# Patient Record
Sex: Male | Born: 1970 | Race: Black or African American | Hispanic: No | Smoking: Never smoker
Health system: Southern US, Community
[De-identification: ages and names within clinical notes are randomized; demographics above are authoritative.]

---

## 1998-10-25 ENCOUNTER — Encounter: Payer: Self-pay | Admitting: Emergency Medicine

## 1998-10-25 ENCOUNTER — Emergency Department (HOSPITAL_COMMUNITY): Admission: EM | Admit: 1998-10-25 | Discharge: 1998-10-25 | Payer: Self-pay | Admitting: Emergency Medicine

## 2000-02-05 ENCOUNTER — Emergency Department (HOSPITAL_COMMUNITY): Admission: EM | Admit: 2000-02-05 | Discharge: 2000-02-05 | Payer: Self-pay | Admitting: Emergency Medicine

## 2000-02-05 ENCOUNTER — Encounter: Payer: Self-pay | Admitting: Emergency Medicine

## 2001-05-04 ENCOUNTER — Emergency Department (HOSPITAL_COMMUNITY): Admission: EM | Admit: 2001-05-04 | Discharge: 2001-05-04 | Payer: Self-pay | Admitting: Emergency Medicine

## 2001-05-08 ENCOUNTER — Emergency Department (HOSPITAL_COMMUNITY): Admission: EM | Admit: 2001-05-08 | Discharge: 2001-05-08 | Payer: Self-pay | Admitting: *Deleted

## 2004-05-02 ENCOUNTER — Emergency Department (HOSPITAL_COMMUNITY): Admission: EM | Admit: 2004-05-02 | Discharge: 2004-05-02 | Payer: Self-pay | Admitting: Emergency Medicine

## 2005-08-12 ENCOUNTER — Emergency Department (HOSPITAL_COMMUNITY): Admission: EM | Admit: 2005-08-12 | Discharge: 2005-08-12 | Payer: Self-pay | Admitting: Emergency Medicine

## 2009-02-05 ENCOUNTER — Emergency Department (HOSPITAL_COMMUNITY): Admission: EM | Admit: 2009-02-05 | Discharge: 2009-02-05 | Payer: Self-pay | Admitting: Emergency Medicine

## 2009-12-19 ENCOUNTER — Emergency Department (HOSPITAL_COMMUNITY): Admission: EM | Admit: 2009-12-19 | Discharge: 2009-12-19 | Payer: Self-pay | Admitting: Emergency Medicine

## 2010-01-02 ENCOUNTER — Emergency Department (HOSPITAL_COMMUNITY): Admission: EM | Admit: 2010-01-02 | Discharge: 2010-01-02 | Payer: Self-pay | Admitting: Emergency Medicine

## 2010-01-28 ENCOUNTER — Emergency Department (HOSPITAL_COMMUNITY): Admission: EM | Admit: 2010-01-28 | Discharge: 2010-01-28 | Payer: Self-pay | Admitting: Emergency Medicine

## 2010-06-15 ENCOUNTER — Emergency Department (HOSPITAL_COMMUNITY): Admission: EM | Admit: 2010-06-15 | Discharge: 2010-06-15 | Payer: Self-pay | Admitting: Emergency Medicine

## 2010-11-15 LAB — URINALYSIS, ROUTINE W REFLEX MICROSCOPIC
Bilirubin Urine: NEGATIVE
Glucose, UA: NEGATIVE mg/dL
Ketones, ur: NEGATIVE mg/dL
Leukocytes, UA: NEGATIVE
Protein, ur: NEGATIVE mg/dL
Protein, ur: NEGATIVE mg/dL
Urobilinogen, UA: 0.2 mg/dL (ref 0.0–1.0)

## 2010-11-15 LAB — URINE CULTURE: Culture: NO GROWTH

## 2010-11-15 LAB — POCT I-STAT, CHEM 8
Chloride: 104 mEq/L (ref 96–112)
HCT: 39 % (ref 39.0–52.0)
Potassium: 3.8 mEq/L (ref 3.5–5.1)
Sodium: 140 mEq/L (ref 135–145)

## 2010-11-15 LAB — URINE MICROSCOPIC-ADD ON

## 2010-12-05 LAB — CSF CELL COUNT WITH DIFFERENTIAL
RBC Count, CSF: 1 /mm3 — ABNORMAL HIGH
Tube #: 1

## 2010-12-05 LAB — CSF CULTURE W GRAM STAIN: Gram Stain: NONE SEEN

## 2010-12-05 LAB — URINALYSIS, ROUTINE W REFLEX MICROSCOPIC
Bilirubin Urine: NEGATIVE
Glucose, UA: NEGATIVE mg/dL
Ketones, ur: NEGATIVE mg/dL
Nitrite: NEGATIVE
Protein, ur: NEGATIVE mg/dL

## 2010-12-05 LAB — GRAM STAIN: Gram Stain: NONE SEEN

## 2011-01-12 ENCOUNTER — Emergency Department (HOSPITAL_COMMUNITY): Payer: Self-pay

## 2011-01-12 ENCOUNTER — Encounter (HOSPITAL_COMMUNITY): Payer: Self-pay | Admitting: Radiology

## 2011-01-12 ENCOUNTER — Emergency Department (HOSPITAL_COMMUNITY)
Admission: EM | Admit: 2011-01-12 | Discharge: 2011-01-13 | Disposition: A | Discharge status: Home / Self Care | Payer: Self-pay | Attending: Emergency Medicine | Admitting: Emergency Medicine

## 2011-01-12 DIAGNOSIS — I517 Cardiomegaly: Secondary | ICD-10-CM | POA: Insufficient documentation

## 2011-01-12 DIAGNOSIS — M129 Arthropathy, unspecified: Secondary | ICD-10-CM | POA: Insufficient documentation

## 2011-01-12 DIAGNOSIS — R0602 Shortness of breath: Secondary | ICD-10-CM | POA: Insufficient documentation

## 2011-01-12 DIAGNOSIS — R0789 Other chest pain: Secondary | ICD-10-CM | POA: Insufficient documentation

## 2011-01-12 LAB — URINALYSIS, ROUTINE W REFLEX MICROSCOPIC
Bilirubin Urine: NEGATIVE
Glucose, UA: NEGATIVE mg/dL
Ketones, ur: NEGATIVE mg/dL
Nitrite: NEGATIVE
Specific Gravity, Urine: 1.015 (ref 1.005–1.030)
pH: 6 (ref 5.0–8.0)

## 2011-01-12 LAB — BASIC METABOLIC PANEL
BUN: 12 mg/dL (ref 6–23)
CO2: 28 mEq/L (ref 19–32)
Chloride: 96 mEq/L (ref 96–112)
Glucose, Bld: 103 mg/dL — ABNORMAL HIGH (ref 70–99)
Potassium: 3.8 mEq/L (ref 3.5–5.1)
Sodium: 133 mEq/L — ABNORMAL LOW (ref 135–145)

## 2011-01-12 LAB — URINE MICROSCOPIC-ADD ON

## 2011-01-12 LAB — DIFFERENTIAL
Eosinophils Absolute: 0.2 10*3/uL (ref 0.0–0.7)
Eosinophils Relative: 3 % (ref 0–5)
Lymphs Abs: 2.1 10*3/uL (ref 0.7–4.0)
Monocytes Absolute: 0.7 10*3/uL (ref 0.1–1.0)

## 2011-01-12 LAB — CBC
HCT: 34.4 % — ABNORMAL LOW (ref 39.0–52.0)
MCH: 31 pg (ref 26.0–34.0)
MCHC: 36 g/dL (ref 30.0–36.0)
MCV: 86 fL (ref 78.0–100.0)
Platelets: 304 10*3/uL (ref 150–400)
RDW: 13.7 % (ref 11.5–15.5)
WBC: 7.2 10*3/uL (ref 4.0–10.5)

## 2011-01-12 LAB — POCT CARDIAC MARKERS: CKMB, poc: 2.5 ng/mL (ref 1.0–8.0)

## 2011-01-12 MED ORDER — IOHEXOL 300 MG/ML  SOLN
100.0000 mL | Freq: Once | INTRAMUSCULAR | Status: AC | PRN
  Administered 2011-01-12: 100 mL via INTRAVENOUS

## 2011-05-04 ENCOUNTER — Emergency Department (HOSPITAL_COMMUNITY)
Admission: EM | Admit: 2011-05-04 | Discharge: 2011-05-04 | Discharge status: Against Medical Advice | Payer: Self-pay | Attending: Emergency Medicine | Admitting: Emergency Medicine

## 2011-05-04 DIAGNOSIS — Z0389 Encounter for observation for other suspected diseases and conditions ruled out: Secondary | ICD-10-CM | POA: Insufficient documentation

## 2011-05-04 LAB — URINALYSIS, ROUTINE W REFLEX MICROSCOPIC
Bilirubin Urine: NEGATIVE
Nitrite: NEGATIVE
Specific Gravity, Urine: 1.014 (ref 1.005–1.030)
Urobilinogen, UA: 0.2 mg/dL (ref 0.0–1.0)
pH: 8 (ref 5.0–8.0)

## 2011-05-04 LAB — URINE MICROSCOPIC-ADD ON

## 2011-07-27 ENCOUNTER — Emergency Department (HOSPITAL_COMMUNITY): Payer: Self-pay

## 2011-07-27 ENCOUNTER — Emergency Department (HOSPITAL_COMMUNITY)
Admission: EM | Admit: 2011-07-27 | Discharge: 2011-07-27 | Disposition: A | Discharge status: Home / Self Care | Payer: Self-pay | Attending: Emergency Medicine | Admitting: Emergency Medicine

## 2011-07-27 DIAGNOSIS — M25561 Pain in right knee: Secondary | ICD-10-CM

## 2011-07-27 DIAGNOSIS — M25532 Pain in left wrist: Secondary | ICD-10-CM

## 2011-07-27 DIAGNOSIS — M25569 Pain in unspecified knee: Secondary | ICD-10-CM | POA: Insufficient documentation

## 2011-07-27 DIAGNOSIS — M25539 Pain in unspecified wrist: Secondary | ICD-10-CM | POA: Insufficient documentation

## 2011-07-27 MED ORDER — IBUPROFEN 600 MG PO TABS: 600.0000 mg | ORAL_TABLET | Freq: Three times a day (TID) | ORAL | Status: AC | PRN

## 2011-07-27 MED ORDER — IBUPROFEN 200 MG PO TABS
600.0000 mg | ORAL_TABLET | Freq: Once | ORAL | Status: AC
  Administered 2011-07-27: 600 mg via ORAL
  Filled 2011-07-27: qty 3

## 2011-07-27 NOTE — ED Provider Notes (Signed)
History     CSN: [id number] Arrival date & time: 07/27/2011 12:27 AM   First MD Initiated Contact with Patient 07/27/11 0028      Chief Complaint  Patient presents with  . Knee Pain    and left wrist pain     (Consider location/radiation/quality/duration/timing/severity/associated sxs/prior treatment) The history is provided by the patient and the police.   please report the patient was in the backseat of the police vehicle and refused to get out and thus he was dragged out of the car.  He now reports pain in his right knee and his left wrist.  He reports pain with range of motion.  His pain is moderate to severe.  Nothing worsens the symptoms don't improve his symptoms.  Symptoms are moderate.  He denies weakness numbness or tingling.  He has no other complaints  No past medical history on file.  No past surgical history on file.  No family history on file.  History  Substance Use Topics  . Smoking status: Not on file  . Smokeless tobacco: Not on file  . Alcohol Use: Not on file      Review of Systems  All other systems reviewed and are negative.    Allergies  Review of patient's allergies indicates no known allergies.  Home Medications  No current outpatient prescriptions on file.  BP 132/89  Pulse 95  Temp(Src) 98.6 F (37 C) (Oral)  Resp 20  SpO2 96%  Physical Exam  Nursing note and vitals reviewed. Constitutional: He is oriented to person, place, and time. He appears well-developed and well-nourished.  HENT:  Head: Normocephalic and atraumatic.  Eyes: EOM are normal.  Neck: Normal range of motion.  Cardiovascular: Normal rate, regular rhythm, normal heart sounds and intact distal pulses.   Pulmonary/Chest: Effort normal and breath sounds normal. No respiratory distress.  Abdominal: Soft. He exhibits no distension. There is no tenderness.  Musculoskeletal:       Patient with tenderness of his lateral joint line of his right knee.  There is no obvious  deformity.  His right leg is neurovascularly intact with normal sensation pulses and motor function.  His left wrist is mildly tender at the proximal portion of his first metacarpal.  There is no deformity or swelling or ecchymosis.  He has normal range of motion of his left wrist.  Neurological: He is alert and oriented to person, place, and time.  Skin: Skin is warm and dry.  Psychiatric: He has a normal mood and affect. Judgment normal.    ED Course  Procedures (including critical care time)  Labs Reviewed - No data to display Dg Wrist Complete Left  07/27/2011  *RADIOLOGY REPORT*  Clinical Data: Left wrist injury.  LEFT WRIST - COMPLETE 3+ VIEW  Comparison:  None.  Findings:  There is no evidence of fracture or dislocation.  There is no evidence of arthropathy or other focal bone abnormality. Soft tissues are unremarkable.  IMPRESSION: Negative.  Original Report Authenticated By: Reola Calkins, M.D.   Dg Knee Complete 4 Views Right  07/27/2011  *RADIOLOGY REPORT*  Clinical Data: Right knee injury.  RIGHT KNEE - COMPLETE 4+ VIEW  Comparison:  None.  Findings:  There is no evidence of fracture, dislocation, or joint effusion.  There is no evidence of arthropathy or other focal bone abnormality.  Soft tissues are unremarkable.  IMPRESSION: Negative.  Original Report Authenticated By: Reola Calkins, M.D.   I personally reviewed the x-ray  1. Pain  in right knee   2. Pain in left wrist       MDM  We'll obtain images to evaluate for fracture.  Ibuprofen given for pain        Lyanne Co, MD 07/27/11 [phone]

## 2011-07-27 NOTE — ED Notes (Signed)
Pt is not under [employer] custody and was provided a bus pass for d/c home. [employer] remains at bedside d/t pt's irritability. Discharge instructions and information given and pt ambulatory with [employer] escorting out of department.

## 2011-07-27 NOTE — ED Notes (Signed)
[employer] bedside 

## 2011-12-19 ENCOUNTER — Emergency Department (HOSPITAL_COMMUNITY): Payer: Self-pay

## 2011-12-19 ENCOUNTER — Emergency Department (HOSPITAL_COMMUNITY)
Admission: EM | Admit: 2011-12-19 | Discharge: 2011-12-19 | Disposition: A | Discharge status: Home / Self Care | Payer: Self-pay | Attending: Emergency Medicine | Admitting: Emergency Medicine

## 2011-12-19 ENCOUNTER — Encounter (HOSPITAL_COMMUNITY): Payer: Self-pay | Admitting: Family Medicine

## 2011-12-19 DIAGNOSIS — F1010 Alcohol abuse, uncomplicated: Secondary | ICD-10-CM | POA: Insufficient documentation

## 2011-12-19 DIAGNOSIS — R109 Unspecified abdominal pain: Secondary | ICD-10-CM | POA: Insufficient documentation

## 2011-12-19 DIAGNOSIS — R112 Nausea with vomiting, unspecified: Secondary | ICD-10-CM | POA: Insufficient documentation

## 2011-12-19 LAB — ETHANOL: Alcohol, Ethyl (B): <11 mg/dL (ref 0–11)

## 2011-12-19 LAB — DIFFERENTIAL
Basophils Relative: 0 % (ref 0–1)
Monocytes Absolute: 0.5 10*3/uL (ref 0.1–1.0)
Monocytes Relative: 8 % (ref 3–12)
Neutro Abs: 4.9 10*3/uL (ref 1.7–7.7)

## 2011-12-19 LAB — CBC
HCT: 36.6 % — ABNORMAL LOW (ref 39.0–52.0)
Hemoglobin: 13.2 g/dL (ref 13.0–17.0)
MCH: 31.4 pg (ref 26.0–34.0)
MCHC: 36.1 g/dL — ABNORMAL HIGH (ref 30.0–36.0)
MCV: 87.1 fL (ref 78.0–100.0)

## 2011-12-19 LAB — COMPREHENSIVE METABOLIC PANEL
Albumin: 4.4 g/dL (ref 3.5–5.2)
BUN: 9 mg/dL (ref 6–23)
Chloride: 96 mEq/L (ref 96–112)
Creatinine, Ser: 0.68 mg/dL (ref 0.50–1.35)
GFR calc non Af Amer: >90 mL/min (ref >90)
Total Bilirubin: 0.7 mg/dL (ref 0.3–1.2)

## 2011-12-19 LAB — LIPASE, BLOOD: Lipase: 18 U/L (ref 11–59)

## 2011-12-19 MED ORDER — MORPHINE SULFATE 4 MG/ML IJ SOLN
4.0000 mg | INTRAMUSCULAR | Status: DC | PRN
  Administered 2011-12-19: 4 mg via INTRAVENOUS
  Filled 2011-12-19: qty 1

## 2011-12-19 MED ORDER — ONDANSETRON HCL 4 MG/2ML IJ SOLN
4.0000 mg | INTRAMUSCULAR | Status: AC | PRN
  Administered 2011-12-19 (×2): 4 mg via INTRAVENOUS
  Filled 2011-12-19 (×2): qty 2

## 2011-12-19 MED ORDER — PANTOPRAZOLE SODIUM 40 MG IV SOLR
40.0000 mg | Freq: Once | INTRAVENOUS | Status: AC
  Administered 2011-12-19: 40 mg via INTRAVENOUS
  Filled 2011-12-19: qty 40

## 2011-12-19 MED ORDER — FAMOTIDINE IN NACL 20-0.9 MG/50ML-% IV SOLN
20.0000 mg | Freq: Once | INTRAVENOUS | Status: AC
  Administered 2011-12-19: 20 mg via INTRAVENOUS
  Filled 2011-12-19: qty 50

## 2011-12-19 MED ORDER — OMEPRAZOLE 20 MG PO CPDR: 20.0000 mg | DELAYED_RELEASE_CAPSULE | Freq: Every day | ORAL | Status: DC

## 2011-12-19 MED ORDER — SODIUM CHLORIDE 0.9 % IV SOLN
INTRAVENOUS | Status: DC
  Administered 2011-12-19: 14:00:00 via INTRAVENOUS

## 2011-12-19 MED ORDER — METOCLOPRAMIDE HCL 10 MG PO TABS: 10.0000 mg | ORAL_TABLET | Freq: Four times a day (QID) | ORAL | Status: DC | PRN

## 2011-12-19 MED ORDER — TRAMADOL HCL 50 MG PO TABS: 50.0000 mg | ORAL_TABLET | Freq: Four times a day (QID) | ORAL | Status: AC | PRN

## 2011-12-19 MED ORDER — IOHEXOL 300 MG/ML  SOLN
100.0000 mL | Freq: Once | INTRAMUSCULAR | Status: AC | PRN
  Administered 2011-12-19: 100 mL via INTRAVENOUS

## 2011-12-19 NOTE — Discharge Instructions (Signed)
Do not drink any alcohol. He drinks anything with alcohol in it, your stomach will get worse. A prescription was written for Prilosec, but you can substitute over-the-counter Prilosec or over-the-counter Pepcid for the Prilosec if he can't afford the prescription.  Abdominal Pain Abdominal pain can be caused by many things. Your caregiver decides the seriousness of your pain by an examination and possibly blood tests and X-rays. Many cases can be observed and treated at home. Most abdominal pain is not caused by a disease and will probably improve without treatment. However, in many cases, more time must pass before a clear cause of the pain can be found. Before that point, it may not be known if you need more testing, or if hospitalization or surgery is needed. HOME CARE INSTRUCTIONS   Do not take laxatives unless directed by your caregiver.   Take pain medicine only as directed by your caregiver.   Only take over-the-counter or prescription medicines for pain, discomfort, or fever as directed by your caregiver.   Try a clear liquid diet (broth, tea, or water) for as long as directed by your caregiver. Slowly move to a bland diet as tolerated.  SEEK IMMEDIATE MEDICAL CARE IF:   The pain does not go away.   You have a fever.   You keep throwing up (vomiting).   The pain is felt only in portions of the abdomen. Pain in the right side could possibly be appendicitis. In an adult, pain in the left lower portion of the abdomen could be colitis or diverticulitis.   You pass bloody or black tarry stools.  MAKE SURE YOU:   Understand these instructions.   Will watch your condition.   Will get help right away if you are not doing well or get worse.  Document Released: 05/24/2005 Document Revised: 08/03/2011 Document Reviewed: 04/01/2008 [facility] Patient Information 2012 [place], [place].  Alcoholic Gastritis You have alcoholic gastritis. This is an inflammation of the lining of the stomach.  It is caused by drinking alcohol. The symptoms may include: burning abdominal pain, nausea, vomiting or even vomiting blood. It can be made worse by a poor diet. People who drink frequently often do not eat well. Taking aspirin or other anti-inflammatory medications increases stomach irritation and bleeding. These medicines should be avoided. Treatment is aimed at the cause. You have to stop drinking alcohol if you want to get better. Eat a healthy, well-balanced diet. You may take liquid antacids as needed. Your caregiver may prescribe medications to help heal the stomach lining. Take these as prescribed. PREVENTION   Anyone who has experienced alcoholic gastritis should consider that alcoholism may be an issue. Professional evaluation is highly recommended.   Although some people can recover without help, most need assistance. With treatment and support, many are able to stop drinking and rebuild their lives. Long-term recovery is possible.   Alcohol Addiction cannot be cured, but it can be treated successfully. Treatment centers are listed in telephone listings under:   Alcoholism and Addiction Treatment; Substance Abuse Treatment or Cocaine, Narcotics and Alcoholics Anonymous. Most hospitals and clinics can refer you to a specialized care center.   The [place] government maintains a toll-free number for treatment referrals: 807-206-3397 or 810-143-0466 (TDD). They also maintain a website: http://findtreatment.RockToxic.pl. Other websites for more information are: www.mentalhealth.RockToxic.pl and GreatestFeeling.tn.   In [place], treatment resources are listed in each province. Listings are available under:   Oncologist for [employer] or similar titles.  SEEK IMMEDIATE MEDICAL CARE IF:  You develop severe abdominal pain, uncontrolled vomiting, or vomiting blood.   You blackout or have fainting spells.   You develop seizures (this could be life threatening).   You develop bloody stools or  stools that appear black or tarry.  Document Released: 09/21/2004 Document Revised: 08/03/2011 Document Reviewed: 08/18/2009 [facility] Patient Information 2012 [place], [place].  Omeprazole capsule (Dr. Reddy's OTC) What is this medicine? OMEPRAZOLE (oh ME pray zol) prevents the production of acid in the stomach. It is used to treat the symptoms of heartburn. You can buy this medicine without a prescription. This product is not for long-term use, unless otherwise directed by your doctor or health care professional. This medicine may be used for other purposes; ask your health care provider or pharmacist if you have questions. What should I tell my health care provider before I take this medicine? They need to know if you have any of these conditions: -black or bloody stools -chest pain -difficulty swallowing -have had heartburn for over 3 months -have heartburn with dizziness, lightheadedness or sweating -liver disease -stomach pain -unexplained weight loss -vomiting with blood -wheezing -an unusual or allergic reaction to omeprazole, other medicines, foods, dyes, or preservatives -pregnant or trying to get pregnant -breast-feeding How should I use this medicine? Take this medicine by mouth. Follow the directions on the product label. If you are taking this medicine without a prescription, take one tablet every day. Do not use for longer than 14 days or repeat a course of treatment more often than every 4 months unless directed by a doctor or healthcare professional. Take your dose at regular intervals every 24 hours. Swallow the tablet whole with a drink of water. Do not crush, break or chew. This medicine works best if taken on an empty stomach 30 minutes before breakfast. If you are using this medicine with the prescription of your doctor or healthcare professional, follow the directions you were given. Do not take your medicine more often than directed. Talk to your pediatrician regarding  the use of this medicine in children. Special care may be needed. Overdosage: If you think you've taken too much of this medicine contact a poison control center or emergency room at once. Overdosage: If you think you have taken too much of this medicine contact a poison control center or emergency room at once. NOTE: This medicine is only for you. Do not share this medicine with others. What if I miss a dose? If you miss a dose, take it as soon as you can. If it is almost time for your next dose, take only that dose. Do not take double or extra doses. What may interact with this medicine? Do not take this medicine with any of the following medications: -atazanavir -certain medicines that treat or prevent blood clots like clopidogrel, warfarin -nelfinavir  This medicine may also interact with the following medications: -ampicillin -certain medicines for anxiety or sleep -cyclosporine -diazepam -digoxin -disulfiram -iron salts -phenytoin -prescription medicine for fungal or yeast infection like itraconazole, ketoconazole, voriconazole -saquinavir -tacrolimus This list may not describe all possible interactions. Give your health care provider a list of all the medicines, herbs, non-prescription drugs, or dietary supplements you use. Also tell them if you smoke, drink alcohol, or use illegal drugs. Some items may interact with your medicine. What should I watch for while using this medicine? It can take several days before your heartburn gets better. Check with your doctor or health care professional if your condition does not start to get  better, or if it gets worse. Do not treat yourself for heartburn with this medicine for more than 14 days in a row. You should only use this medicine for a 2-week treatment period once every 4 months. If your symptoms return shortly after your therapy is complete, or within the 4 month time frame, call your doctor or health care professional. What side  effects may I notice from receiving this medicine? Side effects that you should report to your doctor or health care professional as soon as possible: -blood in urine -bone, muscle or joint pain -chest pain or tightness -dark yellow or brown urine -fever or sore throat -redness, blistering, peeling or loosening of the skin, including inside the mouth -shortness of breath -skin rash -yellowing of the eyes or skin  Side effects that usually do not require medical attention (Report these to your doctor or health care professional if they continue or are bothersome.): -diarrhea or constipation -headache This list may not describe all possible side effects. Call your doctor for medical advice about side effects. You may report side effects to FDA at 1-800-FDA-1088. Where should I keep my medicine? Keep out of the reach of children. Store at room temperature between 20 and 25 degrees C (68 and 77 degrees F). Protect from light and moisture. Throw away any unused medicine after the expiration date. NOTE: This sheet is a summary. It may not cover all possible information. If you have questions about this medicine, talk to your doctor, pharmacist, or health care provider.  2012, [employer]vier/[employer] Standard. (06/15/2010 10:19:01 PM)  Tramadol tablets What is this medicine? TRAMADOL (TRA ma dole) is a pain reliever. It is used to treat moderate to severe pain in adults. This medicine may be used for other purposes; ask your health care provider or pharmacist if you have questions. What should I tell my health care provider before I take this medicine? They need to know if you have any of these conditions: -brain tumor -depression -drug abuse or addiction -head injury -if you frequently drink alcohol containing drinks -kidney disease or trouble passing urine -liver disease -lung disease, asthma, or breathing problems -seizures or epilepsy -suicidal thoughts, plans, or attempt; a previous suicide  attempt by you or a family member -an unusual or allergic reaction to tramadol, codeine, other medicines, foods, dyes, or preservatives -pregnant or trying to get pregnant -breast-feeding How should I use this medicine? Take this medicine by mouth with a full glass of water. Follow the directions on the prescription label. If the medicine upsets your stomach, take it with food or milk. Do not take more medicine than you are told to take. Talk to your pediatrician regarding the use of this medicine in children. Special care may be needed. Overdosage: If you think you have taken too much of this medicine contact a poison control center or emergency room at once. NOTE: This medicine is only for you. Do not share this medicine with others. What if I miss a dose? If you miss a dose, take it as soon as you can. If it is almost time for your next dose, take only that dose. Do not take double or extra doses. What may interact with this medicine? Do not take this medicine with any of the following medications: -MAOIs like Carbex, Eldepryl, Marplan, Nardil, and Parnate This medicine may also interact with the following medications: -alcohol or medicines that contain alcohol -antihistamines -benzodiazepines -bupropion -carbamazepine or oxcarbazepine -clozapine -cyclobenzaprine -digoxin -furazolidone -linezolid -medicines for depression, anxiety,  or psychotic disturbances -medicines for migraine headache like almotriptan, eletriptan, frovatriptan, naratriptan, rizatriptan, sumatriptan, zolmitriptan -medicines for pain like pentazocine, buprenorphine, butorphanol, meperidine, nalbuphine, and propoxyphene -medicines for sleep -muscle relaxants -naltrexone -phenobarbital -phenothiazines like perphenazine, thioridazine, chlorpromazine, mesoridazine, fluphenazine, prochlorperazine, promazine, and trifluoperazine -procarbazine -warfarin This list may not describe all possible interactions. Give your  health care provider a list of all the medicines, herbs, non-prescription drugs, or dietary supplements you use. Also tell them if you smoke, drink alcohol, or use illegal drugs. Some items may interact with your medicine. What should I watch for while using this medicine? Tell your doctor or health care professional if your pain does not go away, if it gets worse, or if you have new or a different type of pain. You may develop tolerance to the medicine. Tolerance means that you will need a higher dose of the medicine for pain relief. Tolerance is normal and is expected if you take this medicine for a long time. Do not suddenly stop taking your medicine because you may develop a severe reaction. Your body becomes used to the medicine. This does NOT mean you are addicted. Addiction is a behavior related to getting and using a drug for a non-medical reason. If you have pain, you have a medical reason to take pain medicine. Your doctor will tell you how much medicine to take. If your doctor wants you to stop the medicine, the dose will be slowly lowered over time to avoid any side effects. You may get drowsy or dizzy. Do not drive, use machinery, or do anything that needs mental alertness until you know how this medicine affects you. Do not stand or sit up quickly, especially if you are an older patient. This reduces the risk of dizzy or fainting spells. Alcohol can increase or decrease the effects of this medicine. Avoid alcoholic drinks. You may have constipation. Try to have a bowel movement at least every 2 to 3 days. If you do not have a bowel movement for 3 days, call your doctor or health care professional. Your mouth may get dry. Chewing sugarless gum or sucking hard candy, and drinking plenty of water may help. Contact your doctor if the problem does not go away or is severe. What side effects may I notice from receiving this medicine? Side effects that you should report to your doctor or health care  professional as soon as possible: -allergic reactions like skin rash, itching or hives, swelling of the face, lips, or tongue -breathing difficulties, wheezing -confusion -itching -light headedness or fainting spells -redness, blistering, peeling or loosening of the skin, including inside the mouth -seizures Side effects that usually do not require medical attention (report to your doctor or health care professional if they continue or are bothersome): -constipation -dizziness -drowsiness -headache -nausea, vomiting This list may not describe all possible side effects. Call your doctor for medical advice about side effects. You may report side effects to FDA at 1-800-FDA-1088. Where should I keep my medicine? Keep out of the reach of children. Store at room temperature between 15 and 30 degrees C (59 and 86 degrees F). Keep container tightly closed. Throw away any unused medicine after the expiration date. NOTE: This sheet is a summary. It may not cover all possible information. If you have questions about this medicine, talk to your doctor, pharmacist, or health care provider.  2012, Elsevier/Gold Standard. (04/27/2010 11:55:44 AM)  Metoclopramide tablets What is this medicine? METOCLOPRAMIDE (met oh kloe PRA mide) is used to  treat the symptoms of gastroesophageal reflux disease (GERD) like heartburn. It is also used to treat people with slow emptying of the stomach and intestinal tract. This medicine may be used for other purposes; ask your health care provider or pharmacist if you have questions. What should I tell my health care provider before I take this medicine? They need to know if you have any of these conditions: -breast cancer -depression -diabetes -heart failure -high blood pressure -kidney disease -liver disease -Parkinson's disease or a movement disorder -pheochromocytoma -seizures -stomach obstruction, bleeding, or perforation -an unusual or allergic reaction to  metoclopramide, procainamide, sulfites, other medicines, foods, dyes, or preservatives -pregnant or trying to get pregnant -breast-feeding How should I use this medicine? Take this medicine by mouth with a glass of water. Follow the directions on the prescription label. Take this medicine on an empty stomach, about 30 minutes before eating. Take your doses at regular intervals. Do not take your medicine more often than directed. Do not stop taking except on the advice of your doctor or health care professional. A special MedGuide will be given to you by the pharmacist with each prescription and refill. Be sure to read this information carefully each time. Talk to your pediatrician regarding the use of this medicine in children. Special care may be needed. Overdosage: If you think you have taken too much of this medicine contact a poison control center or emergency room at once. NOTE: This medicine is only for you. Do not share this medicine with others. What if I miss a dose? If you miss a dose, take it as soon as you can. If it is almost time for your next dose, take only that dose. Do not take double or extra doses. What may interact with this medicine? -acetaminophen -cyclosporine -digoxin -medicines for blood pressure -medicines for diabetes, including insulin -medicines for hay fever and other allergies -medicines for depression, especially an Monoamine Oxidase Inhibitor (MAOI) -medicines for Parkinson's disease, like levodopa -medicines for sleep or for pain -tetracycline This list may not describe all possible interactions. Give your health care provider a list of all the medicines, herbs, non-prescription drugs, or dietary supplements you use. Also tell them if you smoke, drink alcohol, or use illegal drugs. Some items may interact with your medicine. What should I watch for while using this medicine? It may take a few weeks for your stomach condition to start to get better. However,  do not take this medicine for longer than 12 weeks. The longer you take this medicine, and the more you take it, the greater your chances are of developing serious side effects. If you are an elderly patient, a male patient, or you have diabetes, you may be at an increased risk for side effects from this medicine. Contact your doctor immediately if you start having movements you cannot control such as lip smacking, rapid movements of the tongue, involuntary or uncontrollable movements of the eyes, head, arms and legs, or muscle twitches and spasms. Patients and their families should watch out for worsening depression or thoughts of suicide. Also watch out for any sudden or severe changes in feelings such as feeling anxious, agitated, panicky, irritable, hostile, aggressive, impulsive, severely restless, overly excited and hyperactive, or not being able to sleep. If this happens, especially at the beginning of treatment or after a change in dose, call your doctor. Do not treat yourself for high fever. Ask your doctor or health care professional for advice. You may get drowsy or dizzy.  Do not drive, use machinery, or do anything that needs mental alertness until you know how this drug affects you. Do not stand or sit up quickly, especially if you are an older patient. This reduces the risk of dizzy or fainting spells. Alcohol can make you more drowsy and dizzy. Avoid alcoholic drinks. What side effects may I notice from receiving this medicine? Side effects that you should report to your doctor or health care professional as soon as possible: -allergic reactions like skin rash, itching or hives, swelling of the face, lips, or tongue -abnormal production of milk in females -breast enlargement in both males and females -change in the way you walk -difficulty moving, speaking or swallowing -drooling, lip smacking, or rapid movements of the tongue -excessive sweating -fever -involuntary or uncontrollable  movements of the eyes, head, arms and legs -irregular heartbeat or palpitations -muscle twitches and spasms -unusually weak or tired Side effects that usually do not require medical attention (report to your doctor or health care professional if they continue or are bothersome): -change in sex drive or performance -depressed mood -diarrhea -difficulty sleeping -headache -menstrual changes -restless or nervous This list may not describe all possible side effects. Call your doctor for medical advice about side effects. You may report side effects to FDA at 1-800-FDA-1088. Where should I keep my medicine? Keep out of the reach of children. Store at room temperature between 20 and 25 degrees C (68 and 77 degrees F). Protect from light. Keep container tightly closed. Throw away any unused medicine after the expiration date. NOTE: This sheet is a summary. It may not cover all possible information. If you have questions about this medicine, talk to your doctor, pharmacist, or health care provider.  2012, Elsevier/Gold Standard. (04/08/2008 4:30:05 PM)

## 2011-12-19 NOTE — ED Notes (Signed)
Pt reports RUQ abdominal pain more severe today with N/V. Reports drinking a lot of beer last night. Unable to say how many. NAD noted at this time.

## 2011-12-19 NOTE — ED Provider Notes (Signed)
History     CSN: [id number]  Arrival date & time 12/19/11  1259   First MD Initiated Contact with Patient 12/19/11 1306      Chief Complaint  Patient presents with  . Abdominal Pain  . Nausea  . Emesis    HPI Pt was seen at 1305.  Per pt, c/o gradual onset and worsening of persistent generalized abd "pain" for the past 2 weeks, worse since last night.  Has been assoc with multiple intermittent episodes of N/V.  Pt states the pain worsens when he drinks alcohol.  Endorses daily etoh use.  States he "sometimes sees blood" in his emesis.  Denies diarrhea, no black emesis, no blood or black stools, no CP/SOB, no back pain, no fevers.    History reviewed. No pertinent past medical history.  History reviewed. No pertinent past surgical history.   History  Substance Use Topics  . Smoking status: Never Smoker   . Smokeless tobacco: Not on file  . Alcohol Use: Yes     Pt states I drink all day and night, unable to give number of beers    Review of Systems ROS: Statement: All systems negative except as marked or noted in the HPI; Constitutional: Negative for fever and chills. ; ; Eyes: Negative for eye pain, redness and discharge. ; ; ENMT: Negative for ear pain, hoarseness, nasal congestion, sinus pressure and sore throat. ; ; Cardiovascular: Negative for chest pain, palpitations, diaphoresis, dyspnea and peripheral edema. ; ; Respiratory: Negative for cough, wheezing and stridor. ; ; Gastrointestinal: +abd pain, N/V.  Negative for diarrhea, blood in stool, jaundice and rectal bleeding. . ; ; Genitourinary: Negative for dysuria, flank pain and hematuria. ; ; Musculoskeletal: Negative for back pain and neck pain. Negative for swelling and trauma.; ; Skin: Negative for pruritus, rash, abrasions, blisters, bruising and skin lesion.; ; Neuro: Negative for headache, lightheadedness and neck stiffness. Negative for weakness, altered level of consciousness , altered mental status, extremity  weakness, paresthesias, involuntary movement, seizure and syncope.     Allergies  Hydrocodone and Ibuprofen  Home Medications  No current outpatient prescriptions on file.  BP 136/91  Pulse 84  Temp(Src) 98.9 F (37.2 C) (Oral)  Resp 20  SpO2 99%  Physical Exam 1310: Physical examination:  Nursing notes reviewed; Vital signs and O2 SAT reviewed;  Constitutional: Well developed, Well nourished, Well hydrated, In no acute distress; Head:  Normocephalic, atraumatic; Eyes: EOMI, PERRL, No scleral icterus; ENMT: Mouth and pharynx normal, Mucous membranes moist; Neck: Supple, Full range of motion, No lymphadenopathy; Cardiovascular: Regular rate and rhythm, No murmur, rub, or gallop; Respiratory: Breath sounds clear & equal bilaterally, No rales, rhonchi, wheezes, or rub, Normal respiratory effort/excursion; Chest: Nontender, Movement normal; Abdomen: Soft, +diffusely tender to palp, no rebound or guarding, Nondistended, Normal bowel sounds; Extremities: Pulses normal, No tenderness, No edema, No calf edema or asymmetry.; Neuro: AA&Ox3, Major CN grossly intact.  No gross focal motor or sensory deficits in extremities.; Skin: Color normal, Warm, Dry   ED Course  Procedures    MDM  MDM Reviewed: nursing note and vitals Interpretation: labs   Results for orders placed during the hospital encounter of 12/19/11  LIPASE, BLOOD      Component Value Range   Lipase 18  11 - 59 (U/L)  CBC      Component Value Range   WBC 6.9  4.0 - 10.5 (K/uL)   RBC 4.20 (*) 4.22 - 5.81 (MIL/uL)   Hemoglobin 13.2  13.0 - 17.0 (g/dL)   HCT 45.4 (*) 09.8 - 52.0 (%)   MCV 87.1  78.0 - 100.0 (fL)   MCH 31.4  26.0 - 34.0 (pg)   MCHC 36.1 (*) 30.0 - 36.0 (g/dL)   RDW 11.9  14.7 - 82.9 (%)   Platelets 255  150 - 400 (K/uL)  DIFFERENTIAL      Component Value Range   Neutrophils Relative 71  43 - 77 (%)   Neutro Abs 4.9  1.7 - 7.7 (K/uL)   Lymphocytes Relative 16  12 - 46 (%)   Lymphs Abs 1.1  0.7 - 4.0  (K/uL)   Monocytes Relative 8  3 - 12 (%)   Monocytes Absolute 0.5  0.1 - 1.0 (K/uL)   Eosinophils Relative 4  0 - 5 (%)   Eosinophils Absolute 0.3  0.0 - 0.7 (K/uL)   Basophils Relative 0  0 - 1 (%)   Basophils Absolute 0.0  0.0 - 0.1 (K/uL)  COMPREHENSIVE METABOLIC PANEL      Component Value Range   Sodium 135  135 - 145 (mEq/L)   Potassium 5.0  3.5 - 5.1 (mEq/L)   Chloride 96  96 - 112 (mEq/L)   CO2 27  19 - 32 (mEq/L)   Glucose, Bld 89  70 - 99 (mg/dL)   BUN 9  6 - 23 (mg/dL)   Creatinine, Ser 5.62  0.50 - 1.35 (mg/dL)   Calcium 9.9  8.4 - 13.0 (mg/dL)   Total Protein 8.8 (*) 6.0 - 8.3 (g/dL)   Albumin 4.4  3.5 - 5.2 (g/dL)   AST 865 (*) 0 - 37 (U/L)   ALT 61 (*) 0 - 53 (U/L)   Alkaline Phosphatase 106  39 - 117 (U/L)   Total Bilirubin 0.7  0.3 - 1.2 (mg/dL)   GFR calc non Af Amer >90  >90 (mL/min)   GFR calc Af Amer >90  >90 (mL/min)  ETHANOL      Component Value Range   Alcohol, Ethyl (B) <11  0 - 11 (mg/dL)     7846:  Pt still c/o nausea and pain despite meds.  Abd continues diffusely TTP.  Will check CT A/P.  1615:  CT pending.  Sign out to Dr. Preston Fleeting.         Laray Anger, DO 12/20/11 2027

## 2011-12-19 NOTE — ED Notes (Signed)
Pt returned from CT scan.

## 2011-12-19 NOTE — ED Notes (Signed)
Pt reports having nausea 

## 2011-12-19 NOTE — ED Notes (Signed)
Patient transported to CT 

## 2011-12-19 NOTE — ED Provider Notes (Signed)
Patient was advised of results of CT scan. He is advised to discontinue all use of alcohol and was given prescriptions for Prilosec and Reglan. He was also given a prescription for tramadol for pain. He is discharged in good condition.  Results for orders placed during the hospital encounter of 12/19/11  LIPASE, BLOOD      Component Value Range   Lipase 18  11 - 59 (U/L)  CBC      Component Value Range   WBC 6.9  4.0 - 10.5 (K/uL)   RBC 4.20 (*) 4.22 - 5.81 (MIL/uL)   Hemoglobin 13.2  13.0 - 17.0 (g/dL)   HCT 16.1 (*) 09.6 - 52.0 (%)   MCV 87.1  78.0 - 100.0 (fL)   MCH 31.4  26.0 - 34.0 (pg)   MCHC 36.1 (*) 30.0 - 36.0 (g/dL)   RDW 04.5  40.9 - 81.1 (%)   Platelets 255  150 - 400 (K/uL)  DIFFERENTIAL      Component Value Range   Neutrophils Relative 71  43 - 77 (%)   Neutro Abs 4.9  1.7 - 7.7 (K/uL)   Lymphocytes Relative 16  12 - 46 (%)   Lymphs Abs 1.1  0.7 - 4.0 (K/uL)   Monocytes Relative 8  3 - 12 (%)   Monocytes Absolute 0.5  0.1 - 1.0 (K/uL)   Eosinophils Relative 4  0 - 5 (%)   Eosinophils Absolute 0.3  0.0 - 0.7 (K/uL)   Basophils Relative 0  0 - 1 (%)   Basophils Absolute 0.0  0.0 - 0.1 (K/uL)  COMPREHENSIVE METABOLIC PANEL      Component Value Range   Sodium 135  135 - 145 (mEq/L)   Potassium 5.0  3.5 - 5.1 (mEq/L)   Chloride 96  96 - 112 (mEq/L)   CO2 27  19 - 32 (mEq/L)   Glucose, Bld 89  70 - 99 (mg/dL)   BUN 9  6 - 23 (mg/dL)   Creatinine, Ser 9.14  0.50 - 1.35 (mg/dL)   Calcium 9.9  8.4 - 78.2 (mg/dL)   Total Protein 8.8 (*) 6.0 - 8.3 (g/dL)   Albumin 4.4  3.5 - 5.2 (g/dL)   AST 956 (*) 0 - 37 (U/L)   ALT 61 (*) 0 - 53 (U/L)   Alkaline Phosphatase 106  39 - 117 (U/L)   Total Bilirubin 0.7  0.3 - 1.2 (mg/dL)   GFR calc non Af Amer >90  >90 (mL/min)   GFR calc Af Amer >90  >90 (mL/min)  ETHANOL      Component Value Range   Alcohol, Ethyl (B) <11  0 - 11 (mg/dL)   Ct Abdomen Pelvis W Contrast  12/19/2011  *RADIOLOGY REPORT*  Clinical Data: Right-sided  abdominal pain.  Nausea and vomiting.  CT ABDOMEN AND PELVIS WITH CONTRAST  Technique:  Multidetector CT imaging of the abdomen and pelvis was performed following the standard protocol during bolus administration of intravenous contrast.  Contrast: OMNIPAQUE IOHEXOL 300 MG/ML  SOLN  Comparison: 12/19/2009  Findings: Moderate hepatic steatosis demonstrated, but no liver masses are identified.  Gallbladder is unremarkable.  The pancreas, spleen, adrenal glands, and kidneys are normal in appearance.  No evidence of hydronephrosis.  No soft tissue masses or lymphadenopathy identified within the abdomen or pelvis.  No evidence of inflammatory process or abnormal fluid collections.  Normal appendix is visualized.  No evidence of bowel wall thickening or dilatation.  IMPRESSION:  1.  No evidence  of appendicitis or other acute findings. 2.  Moderate hepatic steatosis.  Original Report Authenticated By: Danae Orleans, M.D.      Dione Booze, MD 12/19/11 2330

## 2012-01-05 ENCOUNTER — Emergency Department (HOSPITAL_COMMUNITY)
Admission: EM | Admit: 2012-01-05 | Discharge: 2012-01-05 | Disposition: A | Discharge status: Home / Self Care | Payer: Self-pay | Attending: Emergency Medicine | Admitting: Emergency Medicine

## 2012-01-05 ENCOUNTER — Encounter (HOSPITAL_COMMUNITY): Payer: Self-pay | Admitting: *Deleted

## 2012-01-05 DIAGNOSIS — R1030 Lower abdominal pain, unspecified: Secondary | ICD-10-CM

## 2012-01-05 DIAGNOSIS — R109 Unspecified abdominal pain: Secondary | ICD-10-CM | POA: Insufficient documentation

## 2012-01-05 MED ORDER — NAPROXEN 375 MG PO TABS: 375.0000 mg | ORAL_TABLET | Freq: Three times a day (TID) | ORAL | Status: AC | PRN

## 2012-01-05 NOTE — Progress Notes (Signed)
Pt listed as self pay with no insurance coverage Pt confirms he is self pay [place] resident CM and [facility] community liaison spoke with him Pt offered [facility] services to assist with finding a [place] self pay provider Pt accepted information  Previously on 12/19/11

## 2012-01-05 NOTE — ED Notes (Signed)
Pt states "been having right groin pain, pain in my right chest that I can feel when I push it, can feel it outside, pain in my right knee, pain in my back, don't have a doctor, don't have insurance"

## 2012-01-05 NOTE — ED Provider Notes (Signed)
History     CSN: [id number]  Arrival date & time 01/05/12  6295   First MD Initiated Contact with Patient 01/05/12 [phone]      Chief Complaint  Patient presents with  . Groin Pain    and multiple complaints    (Consider location/radiation/quality/duration/timing/severity/associated sxs/prior treatment) Patient is a 41 y.o. male presenting with groin pain. The history is provided by the patient.  Groin Pain Pertinent negatives include no chest pain, no abdominal pain, no headaches and no shortness of breath.  pt c/o right groin pain for past few days. Constant, dull. Worse w certain movements/palpation. Denies hx same. No swelling to area. Denies recalling injury or strain. No abd pain. No scrotal or testicular pain or swelling. No gu c/o. No leg pain. No back pain. No abd pain. No nv. No fever or chills.   History reviewed. No pertinent past medical history.  History reviewed. No pertinent past surgical history.  No family history on file.  History  Substance Use Topics  . Smoking status: Never Smoker   . Smokeless tobacco: Not on file  . Alcohol Use: Yes     Pt states "not drink in 2-3 weeks now"      Review of Systems  Constitutional: Negative for fever and chills.  HENT: Negative for neck pain.   Eyes: Negative for pain and redness.  Respiratory: Negative for shortness of breath.   Cardiovascular: Negative for chest pain.  Gastrointestinal: Negative for abdominal pain.  Genitourinary: Negative for dysuria, flank pain, scrotal swelling and testicular pain.  Musculoskeletal: Negative for back pain.  Skin: Negative for rash.  Neurological: Negative for headaches.  Hematological: Does not bruise/bleed easily.  Psychiatric/Behavioral: Negative for confusion.    Allergies  Hydrocodone and Ibuprofen  Home Medications   Current Outpatient Rx  Name Route Sig Dispense Refill  . METOCLOPRAMIDE HCL 10 MG PO TABS Oral Take 1 tablet (10 mg total) by mouth every 6 (six)  hours as needed (nausea). 30 tablet 0  . OMEPRAZOLE 20 MG PO CPDR Oral Take 1 capsule (20 mg total) by mouth daily. 30 capsule 0    BP 125/79  Pulse 74  Temp(Src) 97.7 F (36.5 C) (Oral)  Resp 18  Wt 140 lb (63.504 kg)  SpO2 100%  Physical Exam  Nursing note and vitals reviewed. Constitutional: He is oriented to person, place, and time. He appears well-developed and well-nourished. No distress.  HENT:  Head: Atraumatic.  Eyes: Pupils are equal, round, and reactive to light.  Neck: Neck supple. No tracheal deviation present.  Cardiovascular: Normal rate.   Pulmonary/Chest: Effort normal. No accessory muscle usage. No respiratory distress.  Abdominal: Soft. Bowel sounds are normal. He exhibits no distension and no mass. There is no tenderness. There is no rebound and no guarding.       No hernia  Genitourinary: No penile tenderness.       Testes desc bil, no swelling, no tenderness. No scrotal or perineal/groin abscess. No skin changes, lesions or rash.   Musculoskeletal: Normal range of motion. He exhibits no edema.       Mild tenderness localized to right groin. No skin changes or swelling to area. No abscess. No hernia. Fem puls2+. Good rom at right hip without pain.    Neurological: He is alert and oriented to person, place, and time.  Skin: Skin is warm and dry.    ED Course  Procedures (including critical care time)     MDM  Pt with right  groin pain. No scrotal or testicular pain, swelling, or tenderness.  No skin changes, erythema, or abscess to area.   Pt noted on chart w allergy to hydrocodone and ibuprofen - states problem w both was gi upset, no rash/hives, no itching, no sob/wheezing, or other allergic rxn.   Etiology pain appears most likely musculoskeletal, rec nsaid prn, pcp follow up.         Suzi Roots, MD 01/05/12 1010

## 2012-01-05 NOTE — ED Notes (Signed)
Pt states that he has R groin pain for the last week.  Pain came on gradually.  Pt states that pain is worse when he rolls over.  Denies pain with elevation of scrotum.  Pt states pain has gotten worse over past week.  Denies dysuria.

## 2012-01-05 NOTE — Discharge Instructions (Signed)
Take naproxen as need for pain.  Follow up with primary care doctor in 1 week if symptoms fail to improve/resolve. Return to ER if worse, swelling, redness, fevers, severe pain, other concern.      Groin Strain (Adductor Strain) A groin pull/strain refers to strained muscles on the upper inner part of the thigh. This usually occurs in muscles during exercise or participation in sports events. It is more likely to occur when your muscles are not warmed up well enough or if you are not properly conditioned. A pulled muscle, or muscle strain, occurs when a muscle is over stretched and some muscle fibers are torn. This is especially common in athletes where a sudden violent force placed on a muscle pushes it past its ability to respond. Usually, recovery from a pulled muscle takes 1 to 2 weeks, but complete healing will take 5 to 6 weeks.  HOME CARE INSTRUCTIONS   Apply ice to the sore muscle for 15 to 20 minutes every 2-3 hours for the first 2 days. Put ice in a plastic bag and place a towel between the bag of ice and your skin.   Do not strain the pulled muscle for as long as it is still painful.   Only take over-the-counter or prescription medicines for pain, discomfort, or fever as directed by your caregiver. Do not use aspirin as this may increase bleeding (bruising) at injury site.   Warming up before exercise and developing proper conditioning helps prevent muscle strains.  SEEK IMMEDIATE MEDICAL CARE IF:   There is increased pain or swelling in the affected area.   You are not improving or are getting worse.  Document Released: 04/11/2004 Document Revised: 08/03/2011 Document Reviewed: 04/06/2009 [facility] Patient Information 2012 [place], [place].

## 2012-03-23 ENCOUNTER — Emergency Department (HOSPITAL_COMMUNITY): Payer: Self-pay

## 2012-03-23 ENCOUNTER — Emergency Department (HOSPITAL_COMMUNITY)
Admission: EM | Admit: 2012-03-23 | Discharge: 2012-03-23 | Disposition: A | Discharge status: Home / Self Care | Payer: Self-pay | Attending: Emergency Medicine | Admitting: Emergency Medicine

## 2012-03-23 ENCOUNTER — Encounter (HOSPITAL_COMMUNITY): Payer: Self-pay | Admitting: Emergency Medicine

## 2012-03-23 DIAGNOSIS — R079 Chest pain, unspecified: Secondary | ICD-10-CM | POA: Insufficient documentation

## 2012-03-23 DIAGNOSIS — M549 Dorsalgia, unspecified: Secondary | ICD-10-CM | POA: Insufficient documentation

## 2012-03-23 MED ORDER — IBUPROFEN 200 MG PO TABS
400.0000 mg | ORAL_TABLET | Freq: Once | ORAL | Status: AC
  Administered 2012-03-23: 400 mg via ORAL
  Filled 2012-03-23: qty 1

## 2012-03-23 MED ORDER — NAPROXEN 375 MG PO TABS: 375.0000 mg | ORAL_TABLET | Freq: Two times a day (BID) | ORAL | Status: AC | PRN

## 2012-03-23 MED ORDER — HYDROCODONE-ACETAMINOPHEN 5-325 MG PO TABS
2.0000 | ORAL_TABLET | Freq: Once | ORAL | Status: AC
  Administered 2012-03-23: 2 via ORAL
  Filled 2012-03-23 (×2): qty 1
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
## 2013-06-09 ENCOUNTER — Encounter (HOSPITAL_COMMUNITY): Payer: Self-pay | Admitting: Emergency Medicine

## 2013-06-09 ENCOUNTER — Emergency Department (HOSPITAL_COMMUNITY)
Admission: EM | Admit: 2013-06-09 | Discharge: 2013-06-09 | Disposition: A | Discharge status: Home / Self Care | Payer: Medicaid - Out of State | Attending: Emergency Medicine | Admitting: Emergency Medicine

## 2013-06-09 DIAGNOSIS — M545 Low back pain, unspecified: Secondary | ICD-10-CM | POA: Insufficient documentation

## 2013-06-09 DIAGNOSIS — M549 Dorsalgia, unspecified: Secondary | ICD-10-CM

## 2013-06-09 MED ORDER — METHOCARBAMOL 500 MG PO TABS: 1000.0000 mg | ORAL_TABLET | Freq: Four times a day (QID) | ORAL | Status: DC

## 2013-06-09 MED ORDER — TRAMADOL HCL 50 MG PO TABS: 50.0000 mg | ORAL_TABLET | Freq: Four times a day (QID) | ORAL | Status: DC | PRN
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
## 2013-06-09 NOTE — ED Notes (Signed)
Pt c/o mid to lower back pain x weeks that is worse with movement and position change

## 2013-06-10 NOTE — ED Provider Notes (Signed)
Medical screening examination/treatment/procedure(s) were performed by non-physician practitioner and as supervising physician I was immediately available for consultation/collaboration.   Junius Argyle, MD 06/10/13 [phone]

## 2013-06-19 ENCOUNTER — Encounter (HOSPITAL_COMMUNITY): Payer: Self-pay | Admitting: Emergency Medicine

## 2013-06-19 ENCOUNTER — Emergency Department (HOSPITAL_COMMUNITY)
Admission: EM | Admit: 2013-06-19 | Discharge: 2013-06-19 | Disposition: A | Discharge status: Home / Self Care | Payer: Medicaid - Out of State | Attending: Emergency Medicine | Admitting: Emergency Medicine

## 2013-06-19 DIAGNOSIS — Z79899 Other long term (current) drug therapy: Secondary | ICD-10-CM | POA: Insufficient documentation

## 2013-06-19 DIAGNOSIS — Z202 Contact with and (suspected) exposure to infections with a predominantly sexual mode of transmission: Secondary | ICD-10-CM

## 2013-06-19 DIAGNOSIS — R300 Dysuria: Secondary | ICD-10-CM | POA: Insufficient documentation

## 2013-06-19 DIAGNOSIS — R369 Urethral discharge, unspecified: Secondary | ICD-10-CM | POA: Insufficient documentation

## 2013-06-19 MED ORDER — CEFTRIAXONE SODIUM 250 MG IJ SOLR
250.0000 mg | Freq: Once | INTRAMUSCULAR | Status: AC
  Administered 2013-06-19: 250 mg via INTRAMUSCULAR
  Filled 2013-06-19: qty 250

## 2013-06-19 MED ORDER — LIDOCAINE HCL (PF) 1 % IJ SOLN
INTRAMUSCULAR | Status: AC
  Administered 2013-06-19: 0.9 mL
  Filled 2013-06-19: qty 5

## 2013-06-19 MED ORDER — AZITHROMYCIN 1 G PO PACK
1.0000 g | PACK | Freq: Once | ORAL | Status: AC
  Administered 2013-06-19: 1 g via ORAL
  Filled 2013-06-19: qty 1

## 2013-06-19 NOTE — ED Notes (Signed)
Pt reports having penile discharge and pain with urination.

## 2013-06-19 NOTE — ED Provider Notes (Signed)
CSN: [id number]     Arrival date & time 06/19/13  1748 History   First MD Initiated Contact with Patient 06/19/13 1757     Chief Complaint  Patient presents with  . SEXUALLY TRANSMITTED DISEASE   (Consider location/radiation/quality/duration/timing/severity/associated sxs/prior Treatment) HPI Comments: Patient is a 42 year old male presented to the emergency department for severe dysuria and scant white penile discharge for the last 4 days. Patient states prior to the onset of this he does have unprotected sexual intercourse w/ a partner of unknown status. Patient denies any fevers, testicular or penile swelling, testicular or penile pain. Patient denies any history of sexually-transmitted diseases in the past.    History reviewed. No pertinent past medical history. History reviewed. No pertinent past surgical history. History reviewed. No pertinent family history. History  Substance Use Topics  . Smoking status: Never Smoker   . Smokeless tobacco: Not on file  . Alcohol Use: Yes     Comment: Pt states "not drink in 2-3 weeks now"    Review of Systems  Constitutional: Negative for fever.  Gastrointestinal: Negative for vomiting.  Genitourinary: Positive for dysuria and discharge. Negative for penile swelling, scrotal swelling, penile pain and testicular pain.    Allergies  Hydrocodone and Ibuprofen  Home Medications   Current Outpatient Rx  Name  Route  Sig  Dispense  Refill  . acetaminophen (TYLENOL) 500 MG tablet   Oral   Take 500 mg by mouth every 6 (six) hours as needed for pain.         . methocarbamol (ROBAXIN) 500 MG tablet   Oral   Take 2 tablets (1,000 mg total) by mouth 4 (four) times daily.   20 tablet   0   . traMADol (ULTRAM) 50 MG tablet   Oral   Take 1 tablet (50 mg total) by mouth every 6 (six) hours as needed for pain.   15 tablet   0    BP 138/85  Pulse 74  Temp(Src) 98.4 F (36.9 C) (Oral)  Resp 18  SpO2 98% Physical Exam   Constitutional: He is oriented to person, place, and time. He appears well-developed and well-nourished. No distress.  HENT:  Head: Normocephalic and atraumatic.  Right Ear: External ear normal.  Left Ear: External ear normal.  Nose: Nose normal.  Mouth/Throat: Oropharynx is clear and moist.  Eyes: Conjunctivae are normal.  Neck: Normal range of motion. Neck supple.  Cardiovascular: Normal rate.   Pulmonary/Chest: Effort normal.  Abdominal: Soft.  Genitourinary: Testes normal and penis normal. Circumcised. No hypospadias, penile erythema or penile tenderness. No discharge found.  Musculoskeletal: Normal range of motion.  Lymphadenopathy:       Right: No inguinal adenopathy present.       Left: No inguinal adenopathy present.  Neurological: He is alert and oriented to person, place, and time.  Skin: Skin is warm and dry. He is not diaphoretic.  Psychiatric: He has a normal mood and affect.    ED Course  Procedures (including critical care time) Medications  azithromycin (ZITHROMAX) powder 1 g (1 g Oral Given 06/19/13 1838)  cefTRIAXone (ROCEPHIN) injection 250 mg (250 mg Intramuscular Given 06/19/13 1837)  lidocaine (PF) (XYLOCAINE) 1 % injection (0.9 mLs  Given 06/19/13 1837)    Labs Review Labs Reviewed  GC/CHLAMYDIA PROBE AMP   Imaging Review No results found.  EKG Interpretation   None       MDM   1. Sexually transmitted disease exposure     Afebrile, NAD,  non-toxic appearing, AAOx4. Patient to be discharged with instructions to follow up with PCP or ED or STI clinic. Pt understands GC/Chlamydia cultures pending and that they will need to inform all sexual partners within the last 6 months if results return positive. Pt has been treated prophylacticly with azithromycin and rocephin due to pts history. Pt advised that he will receive a call in 48 hours if the test is positive and to refrain from sexual activity for 48 hours. If the test is positive, pt is advised  to refrain from sexual activity for 10 days for the medicine to take effect. No signs of other testicular or penile infections. Discussed that because pt has had recent unprotected sex, might want to consider getting tested for HIV as well. Counseled pt that latex condoms are the only way to prevent against STDs or HIV. Return precautions discussed. Patient is agreeable to plan. Patient is stable at time of discharge            Jeannetta Ellis, PA-C 06/19/13 1932

## 2013-06-20 LAB — GC/CHLAMYDIA PROBE AMP: CT Probe RNA: NEGATIVE

## 2013-06-21 NOTE — ED Provider Notes (Signed)
Medical screening examination/treatment/procedure(s) were performed by non-physician practitioner and as supervising physician I was immediately available for consultation/collaboration.  EKG Interpretation   None         Joya Gaskins, MD 06/21/13 [phone]

## 2013-07-29 ENCOUNTER — Encounter (HOSPITAL_COMMUNITY): Payer: Self-pay | Admitting: Emergency Medicine

## 2013-07-29 ENCOUNTER — Emergency Department (HOSPITAL_COMMUNITY)
Admission: EM | Admit: 2013-07-29 | Discharge: 2013-07-29 | Disposition: A | Discharge status: Home / Self Care | Payer: Self-pay | Attending: Emergency Medicine | Admitting: Emergency Medicine

## 2013-07-29 ENCOUNTER — Emergency Department (HOSPITAL_COMMUNITY): Payer: Self-pay

## 2013-07-29 DIAGNOSIS — K529 Noninfective gastroenteritis and colitis, unspecified: Secondary | ICD-10-CM

## 2013-07-29 DIAGNOSIS — R109 Unspecified abdominal pain: Secondary | ICD-10-CM

## 2013-07-29 DIAGNOSIS — K5289 Other specified noninfective gastroenteritis and colitis: Secondary | ICD-10-CM | POA: Insufficient documentation

## 2013-07-29 DIAGNOSIS — Z79899 Other long term (current) drug therapy: Secondary | ICD-10-CM | POA: Insufficient documentation

## 2013-07-29 DIAGNOSIS — Z792 Long term (current) use of antibiotics: Secondary | ICD-10-CM | POA: Insufficient documentation

## 2013-07-29 LAB — CBC WITH DIFFERENTIAL/PLATELET
Eosinophils Absolute: 0 10*3/uL (ref 0.0–0.7)
Eosinophils Relative: 1 % (ref 0–5)
HCT: 35.8 % — ABNORMAL LOW (ref 39.0–52.0)
Lymphocytes Relative: 10 % — ABNORMAL LOW (ref 12–46)
Lymphs Abs: 0.8 10*3/uL (ref 0.7–4.0)
MCH: 31.3 pg (ref 26.0–34.0)
MCV: 86.1 fL (ref 78.0–100.0)
Monocytes Absolute: 0.4 10*3/uL (ref 0.1–1.0)
RBC: 4.16 MIL/uL — ABNORMAL LOW (ref 4.22–5.81)
WBC: 8.7 10*3/uL (ref 4.0–10.5)

## 2013-07-29 LAB — URINALYSIS, ROUTINE W REFLEX MICROSCOPIC
Nitrite: NEGATIVE
Protein, ur: NEGATIVE mg/dL
Urobilinogen, UA: 0.2 mg/dL (ref 0.0–1.0)

## 2013-07-29 LAB — COMPREHENSIVE METABOLIC PANEL
ALT: 21 U/L (ref 0–53)
BUN: 9 mg/dL (ref 6–23)
CO2: 27 mEq/L (ref 19–32)
Calcium: 9.7 mg/dL (ref 8.4–10.5)
Creatinine, Ser: 0.71 mg/dL (ref 0.50–1.35)
GFR calc Af Amer: >90 mL/min (ref >90)
GFR calc non Af Amer: >90 mL/min (ref >90)
Glucose, Bld: 107 mg/dL — ABNORMAL HIGH (ref 70–99)

## 2013-07-29 LAB — ETHANOL: Alcohol, Ethyl (B): <11 mg/dL (ref 0–11)

## 2013-07-29 LAB — URINE MICROSCOPIC-ADD ON

## 2013-07-29 LAB — LIPASE, BLOOD: Lipase: 14 U/L (ref 11–59)

## 2013-07-29 MED ORDER — ONDANSETRON HCL 4 MG PO TABS: 4.0000 mg | ORAL_TABLET | Freq: Four times a day (QID) | ORAL | Status: AC

## 2013-07-29 MED ORDER — ONDANSETRON HCL 4 MG/2ML IJ SOLN
4.0000 mg | Freq: Once | INTRAMUSCULAR | Status: AC
  Administered 2013-07-29: 4 mg via INTRAVENOUS
  Filled 2013-07-29: qty 2

## 2013-07-29 MED ORDER — HYDROCODONE-ACETAMINOPHEN 5-325 MG PO TABS: 2.0000 | ORAL_TABLET | ORAL | Status: AC | PRN

## 2013-07-29 MED ORDER — IOHEXOL 300 MG/ML  SOLN
100.0000 mL | Freq: Once | INTRAMUSCULAR | Status: AC | PRN
  Administered 2013-07-29: 100 mL via INTRAVENOUS

## 2013-07-29 MED ORDER — HYDROMORPHONE HCL PF 1 MG/ML IJ SOLN
1.0000 mg | Freq: Once | INTRAMUSCULAR | Status: AC
  Administered 2013-07-29: 1 mg via INTRAVENOUS
  Filled 2013-07-29: qty 1

## 2013-07-29 MED ORDER — OMEPRAZOLE 20 MG PO CPDR: 20.0000 mg | DELAYED_RELEASE_CAPSULE | Freq: Every day | ORAL | Status: AC

## 2013-07-29 MED ORDER — SODIUM CHLORIDE 0.9 % IV SOLN
Freq: Once | INTRAVENOUS | Status: AC
  Administered 2013-07-29: 19:00:00 via INTRAVENOUS

## 2013-07-29 MED ORDER — METRONIDAZOLE 500 MG PO TABS: 500.0000 mg | ORAL_TABLET | Freq: Two times a day (BID) | ORAL | Status: AC

## 2013-07-29 MED ORDER — PANTOPRAZOLE SODIUM 40 MG IV SOLR
40.0000 mg | Freq: Once | INTRAVENOUS | Status: AC
  Administered 2013-07-29: 40 mg via INTRAVENOUS
  Filled 2013-07-29: qty 40

## 2013-07-29 MED ORDER — SODIUM CHLORIDE 0.9 % IV BOLUS (SEPSIS)
1000.0000 mL | Freq: Once | INTRAVENOUS | Status: AC
  Administered 2013-07-29: 1000 mL via INTRAVENOUS

## 2013-07-29 MED ORDER — IOHEXOL 300 MG/ML  SOLN
25.0000 mL | INTRAMUSCULAR | Status: AC
  Administered 2013-07-29: 25 mL via ORAL

## 2013-07-29 MED ORDER — CIPROFLOXACIN HCL 500 MG PO TABS: 500.0000 mg | ORAL_TABLET | Freq: Two times a day (BID) | ORAL | Status: AC

## 2013-07-29 MED ORDER — ONDANSETRON HCL 4 MG/2ML IJ SOLN
4.0000 mg | Freq: Once | INTRAMUSCULAR | Status: AC
Start: 2013-07-29 — End: 2013-07-29
  Administered 2013-07-29: 4 mg via INTRAVENOUS
  Filled 2013-07-29: qty 2

## 2013-07-29 NOTE — ED Provider Notes (Signed)
CSN: [id number]     Arrival date & time 07/29/13  1708 History   First MD Initiated Contact with Patient 07/29/13 1719     Chief Complaint  Patient presents with  . Abdominal Pain   (Consider location/radiation/quality/duration/timing/severity/associated sxs/prior Treatment) HPI Comments: Patient complains of diffuse abdominal pain with nausea and vomiting since yesterday. He is a poor historian. He states the pain is diffuse across his abdomen is unable to keep anything down.  He has had loose Stools that are not bloody. He denie any fevers chills dysuria or testicle pain. He admits to alcohol intake but has difficulty quantifying how much. He states he drinks beer but can't state how many. He's had pain previously but never this severe. The pain radiates to her low back. Denies any bowel or bladder incontinence. He is from [place] has been in the [place] for 8 months. Denies any recent travel to [place].  No fevers.  The history is provided by the patient. The history is limited by the condition of the patient.    History reviewed. No pertinent past medical history. History reviewed. No pertinent past surgical history. History reviewed. No pertinent family history. History  Substance Use Topics  . Smoking status: Never Smoker   . Smokeless tobacco: Not on file  . Alcohol Use: Yes    Review of Systems  Constitutional: Positive for activity change and appetite change. Negative for fever and fatigue.  Respiratory: Negative for cough, chest tightness and shortness of breath.   Cardiovascular: Negative for chest pain.  Gastrointestinal: Positive for nausea, vomiting, abdominal pain and diarrhea. Negative for constipation and blood in stool.  Genitourinary: Negative for dysuria and hematuria.  Musculoskeletal: Negative for back pain.  Skin: Negative for rash.  Neurological: Negative for dizziness, weakness and headaches.  A complete 10 system review of systems was obtained and all  systems are negative except as noted in the HPI and PMH.    Allergies  Review of patient's allergies indicates no known allergies.  Home Medications   Current Outpatient Rx  Name  Route  Sig  Dispense  Refill  . ciprofloxacin (CIPRO) 500 MG tablet   Oral   Take 1 tablet (500 mg total) by mouth every 12 (twelve) hours.   14 tablet   0   . HYDROcodone-acetaminophen (NORCO/VICODIN) 5-325 MG per tablet   Oral   Take 2 tablets by mouth every 4 (four) hours as needed.   10 tablet   0   . metroNIDAZOLE (FLAGYL) 500 MG tablet   Oral   Take 1 tablet (500 mg total) by mouth 2 (two) times daily.   14 tablet   0   . omeprazole (PRILOSEC) 20 MG capsule   Oral   Take 1 capsule (20 mg total) by mouth daily.   30 capsule   0   . ondansetron (ZOFRAN) 4 MG tablet   Oral   Take 1 tablet (4 mg total) by mouth every 6 (six) hours.   12 tablet   0    BP 117/80  Pulse 62  Temp(Src) 98.9 F (37.2 C) (Oral)  Resp 20  SpO2 99% Physical Exam  Constitutional: He is oriented to person, place, and time. He appears well-developed and well-nourished. He appears distressed.  Uncomfortable, writhing on the bed  HENT:  Head: Normocephalic and atraumatic.  Mouth/Throat: Oropharynx is clear and moist. No oropharyngeal exudate.  Eyes: Conjunctivae and EOM are normal. Pupils are equal, round, and reactive to light.  Neck: Normal  range of motion. Neck supple.  Cardiovascular: Normal rate, regular rhythm and normal heart sounds.   No murmur heard. Pulmonary/Chest: Effort normal and breath sounds normal. No respiratory distress.  Abdominal: Soft. There is tenderness. There is no rebound and no guarding.  Diffuse abdominal tenderness without peritoneal signs  Genitourinary:  No testicular tenderness  Musculoskeletal: Normal range of motion. He exhibits no edema and no tenderness.  Paraspinal lumbar tenderness, no CVA tenderness 5/5 strength in bilateral lower extremities. Ankle plantar and  dorsiflexion intact. Great toe extension intact bilaterally. +2 DP and PT pulses. +2 patellar reflexes bilaterally. Normal gait.   Neurological: He is alert and oriented to person, place, and time. No cranial nerve deficit. He exhibits normal muscle tone. Coordination normal.  Skin: Skin is warm.    ED Course  Procedures (including critical care time) Labs Review Labs Reviewed  CBC WITH DIFFERENTIAL - Abnormal; Notable for the following:    RBC 4.16 (*)    HCT 35.8 (*)    MCHC 36.3 (*)    Neutrophils Relative % 85 (*)    Lymphocytes Relative 10 (*)    All other components within normal limits  COMPREHENSIVE METABOLIC PANEL - Abnormal; Notable for the following:    Chloride 93 (*)    Glucose, Bld 107 (*)    Total Protein 8.9 (*)    AST 38 (*)    All other components within normal limits  URINALYSIS, ROUTINE W REFLEX MICROSCOPIC - Abnormal; Notable for the following:    Specific Gravity, Urine 1.031 (*)    Hgb urine dipstick TRACE (*)    Ketones, ur 15 (*)    All other components within normal limits  CG4 I-STAT (LACTIC ACID) - Abnormal; Notable for the following:    Lactic Acid, Venous 2.45 (*)    All other components within normal limits  LIPASE, BLOOD  ETHANOL  URINE MICROSCOPIC-ADD ON  OCCULT BLOOD, POC DEVICE  CG4 I-STAT (LACTIC ACID)   Imaging Review Ct Abdomen Pelvis W Contrast  07/29/2013   CLINICAL DATA:  Abdominal pain with nausea and vomiting for 1 day.  EXAM: CT ABDOMEN AND PELVIS WITH CONTRAST  TECHNIQUE: Multidetector CT imaging of the abdomen and pelvis was performed using the standard protocol following bolus administration of intravenous contrast.  CONTRAST:  OMNIPAQUE IOHEXOL 300 MG/ML  SOLN  COMPARISON:  None.  FINDINGS: Lower Chest: Clear lung bases. Heart size upper normal with mild right atrial and right ventricular enlargement. No pericardial or pleural effusion.  Abdomen/Pelvis: Mild hepatic steatosis. Scattered hepatic calcifications which are  likely related to remote granulomatous disease.  Normal spleen, stomach, pancreas, gallbladder, biliary tract, adrenal glands, kidneys.  No retroperitoneal or retrocrural adenopathy. The colon is underdistended throughout. Apparent wall thickening, especially involving the ascending colon (image 57/ series 2) is favored to be secondary. Normal terminal ileum. Appendix likely identified on image 62, normal.  Normal small bowel without abdominal ascites.  No pelvic adenopathy. Normal urinary bladder and prostate. No significant free fluid.  Bones/Musculoskeletal: Partial fusion of the bilateral sacroiliac joints. Remote right rib trauma. Prominent anterior syndesmophytes throughout the thoracolumbar spine.  IMPRESSION: 1. Underdistended colon. Apparent wall thickening could be secondary. Diffuse Mild colitis (infection versus ulcerative colitis) cannot be excluded. 2. Otherwise, no explanation for patient's symptoms. 3. Hepatic steatosis. 4. 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   Electronically Signed   By: Jeronimo Greaves M.D.   On: 07/29/2013 20:21    EKG Interpretation  None       MDM   1. Abdominal pain   2. Colitis    Abdominal pain nausea vomiting history of alcohol abuse. Patient not forthcoming with his alcohol intake. No peritoneal signs on exam. Labs, IV fluids, pain control, antiemetics. Suspect gastritis or pancreatitis.  LFTs and lipase normal. Likely mildly elevated.  CT scan shows underdistended colon with wall thickening. Appendix appears to be normal. No ascites. Patient has had no vomiting in the ED. his abdomen remains soft without peritoneal signs.  On recheck lactate has improved to 1.3. Patient tolerating by mouth in the ED. CT scan results discussed with Dr. Reche Dixon. Celiac, SMA and IMA appear to be patent. Findings not consistent with ischemic colitis. He suspects under distention is more likely than colitis at  all.  Patient will be prescribed pain medication, PPI, antibiotics and Flagyl. He is referred to the wellness center to establish care.  BP 117/80  Pulse 62  Temp(Src) 98.9 F (37.2 C) (Oral)  Resp 20  SpO2 99%   Glynn Octave, MD 07/30/13 [phone]

## 2013-07-29 NOTE — ED Notes (Addendum)
Pt alert, NAD, calm, interactive, talking on cell phone, EDP Dr. Manus Gunning into room to speak with pt about findings and d/c plan. Safe to go home, nothing emergent explained. Meds explained. Pt not happy, states, "my stomach does not feel right". Rx, CT and labs explained. Pt dressing self, steady on feet.

## 2013-07-29 NOTE — ED Notes (Signed)
Pt reports continued nausea, but currently sipping on water at bedside.

## 2013-07-29 NOTE — ED Notes (Signed)
Pt reports abd pain and vomiting since yesterday.

## 2013-07-29 NOTE — ED Notes (Signed)
Pt refusing to leave, stating he "doesn't feel good, I know my body, I'll just come right back". MD notified.

## 2013-07-29 NOTE — ED Notes (Signed)
Pt dressed and in b/r.

## 2013-07-29 NOTE — ED Notes (Signed)
Pt out in w/c to w/r by RN, pt calling cab, using personal cell phone, pt has spoken with family by phone, no changes, alert, NAD, calm, polite.

## 2013-07-29 NOTE — ED Notes (Signed)
Steady gait in room and to b/r, self transferred to w/c, pt out to w/r by w/c with RN, alert, NAD, calm, interactive, polite, not happy about leaving, still does not feel well, plan of care and Rx explained, pt has spoken with family on phone, denies questions, calling taxi.

## 2013-08-03 ENCOUNTER — Emergency Department (HOSPITAL_COMMUNITY)
Admission: EM | Admit: 2013-08-03 | Discharge: 2013-08-03 | Disposition: A | Discharge status: Home / Self Care | Payer: Medicaid - Out of State | Attending: Emergency Medicine | Admitting: Emergency Medicine

## 2013-08-03 ENCOUNTER — Encounter (HOSPITAL_COMMUNITY): Payer: Self-pay | Admitting: Emergency Medicine

## 2013-08-03 DIAGNOSIS — R509 Fever, unspecified: Secondary | ICD-10-CM | POA: Insufficient documentation

## 2013-08-03 DIAGNOSIS — M549 Dorsalgia, unspecified: Secondary | ICD-10-CM | POA: Insufficient documentation

## 2013-08-03 DIAGNOSIS — N419 Inflammatory disease of prostate, unspecified: Secondary | ICD-10-CM | POA: Insufficient documentation

## 2013-08-03 DIAGNOSIS — R110 Nausea: Secondary | ICD-10-CM | POA: Insufficient documentation

## 2013-08-03 LAB — BASIC METABOLIC PANEL
BUN: 7 mg/dL (ref 6–23)
CO2: 28 mEq/L (ref 19–32)
Chloride: 99 mEq/L (ref 96–112)
Creatinine, Ser: 0.75 mg/dL (ref 0.50–1.35)
Potassium: 3.3 mEq/L — ABNORMAL LOW (ref 3.5–5.1)

## 2013-08-03 LAB — CBC WITH DIFFERENTIAL/PLATELET
Basophils Absolute: 0 10*3/uL (ref 0.0–0.1)
HCT: 34.8 % — ABNORMAL LOW (ref 39.0–52.0)
Hemoglobin: 12.1 g/dL — ABNORMAL LOW (ref 13.0–17.0)
Lymphocytes Relative: 33 % (ref 12–46)
Monocytes Absolute: 0.5 10*3/uL (ref 0.1–1.0)
Monocytes Relative: 10 % (ref 3–12)
Neutro Abs: 2.6 10*3/uL (ref 1.7–7.7)
WBC: 4.8 10*3/uL (ref 4.0–10.5)

## 2013-08-03 LAB — URINE MICROSCOPIC-ADD ON

## 2013-08-03 LAB — URINALYSIS, ROUTINE W REFLEX MICROSCOPIC
Bilirubin Urine: NEGATIVE
Ketones, ur: NEGATIVE mg/dL
Protein, ur: NEGATIVE mg/dL
Urobilinogen, UA: 0.2 mg/dL (ref 0.0–1.0)

## 2013-08-03 MED ORDER — ACETAMINOPHEN 325 MG PO TABS
650.0000 mg | ORAL_TABLET | Freq: Once | ORAL | Status: AC
  Administered 2013-08-03: 650 mg via ORAL
  Filled 2013-08-03: qty 2

## 2013-08-03 MED ORDER — CIPROFLOXACIN HCL 500 MG PO TABS
500.0000 mg | ORAL_TABLET | Freq: Once | ORAL | Status: AC
  Administered 2013-08-03: 500 mg via ORAL
  Filled 2013-08-03: qty 1

## 2013-08-03 MED ORDER — CIPROFLOXACIN HCL 500 MG PO TABS: 500.0000 mg | ORAL_TABLET | Freq: Two times a day (BID) | ORAL | Status: AC

## 2013-08-03 NOTE — ED Notes (Signed)
Pt c/o burning with urination for several days.  Pt states burning radiates in to his scrotum and several days ago, he had a burning sensation in his lower abdomen with urination.  Pt states he feels as though he has had a fever, but has not measured his temperature.  Pt denies hematuria and penile discharge.

## 2013-08-03 NOTE — ED Provider Notes (Signed)
CSN: [id number]     Arrival date & time 08/03/13  1054 History  This chart was scribed for non-physician practitioner, Luiz Iron, PA-C,working with Nelia Shi, MD, by Karle Plumber, ED Scribe.  This patient was seen in room WTR9/WTR9 and the patient's care was started at 1:01 PM.  Chief Complaint  Patient presents with  . Dysuria   The history is provided by the patient. No language interpreter was used.   HPI Comments:  James Joseph is a 42 y.o. male who presents to the Emergency Department complaining of dysuria for approximately three days. Pt states he was feeling a burning pain in the lower middle abdomen/suprapubic area yesterday, but has since resolved. He states he has a burning sensation in his genitals and perineal area, which is constant and severe, even when he is not urinating.  He reports associated nausea and mid to right-sided back pain. He vomited a few times after drinking alcohol a few days ago but this has not been ongoing.  He denies taking anything for pain. He reports a subjective fever and chills.  He denies any rectal bleeding, rectal pain, penile discharge, genital sores, or cough. He denies any known STDs. He was tested for gonorrhea and chlamydia on 06/19/13 which was negative.  He reports having a negative HIV test in April. He reports only male sexual partners and reports having new sexual partners recently. He denies any recent travel with the last trip being 9 months ago from [place].    History reviewed. No pertinent past medical history. History reviewed. No pertinent past surgical history. No family history on file. History  Substance Use Topics  . Smoking status: Never Smoker   . Smokeless tobacco: Never Used  . Alcohol Use: Yes     Comment: Pt states "not drink in 2-3 weeks now"    Review of Systems  Constitutional: Positive for fever (subjective) and chills. Negative for diaphoresis, activity change, appetite change and fatigue.   HENT: Negative for sore throat.   Respiratory: Negative for cough and shortness of breath.   Cardiovascular: Negative for chest pain and leg swelling.  Gastrointestinal: Positive for nausea, vomiting (resolved) and abdominal pain (resolved). Negative for diarrhea, constipation, blood in stool, anal bleeding and rectal pain.  Genitourinary: Positive for dysuria. Negative for urgency, frequency, hematuria, flank pain, decreased urine volume, discharge, penile swelling, scrotal swelling, difficulty urinating, genital sores and testicular pain.  Musculoskeletal: Positive for back pain (mid to right lower). Negative for gait problem and myalgias.  Skin: Negative for color change.  Neurological: Negative for weakness and headaches.    Allergies  Hydrocodone and Ibuprofen  Home Medications   Current Outpatient Rx  Name  Route  Sig  Dispense  Refill  . acetaminophen (TYLENOL) 500 MG tablet   Oral   Take 500 mg by mouth every 6 (six) hours as needed for pain.          Triage Vitals: BP 147/81  Pulse 58  Temp(Src) 98.4 F (36.9 C) (Oral)  Resp 18  SpO2 100%  Filed Vitals:   08/03/13 1141 08/03/13 1431  BP: 147/81 113/84  Pulse: 58   Temp: 98.4 F (36.9 C)   TempSrc: Oral   Resp: 18   SpO2: 100%      Physical Exam  Nursing note and vitals reviewed. Constitutional: He is oriented to person, place, and time. He appears well-developed and well-nourished. No distress.  HENT:  Head: Normocephalic and atraumatic.  Right Ear: External ear  normal.  Left Ear: External ear normal.  Nose: Nose normal.  Mouth/Throat: Oropharynx is clear and moist. No oropharyngeal exudate.  Eyes: Conjunctivae are normal. Right eye exhibits no discharge. Left eye exhibits no discharge.  Neck: Normal range of motion. Neck supple.  Cardiovascular: Normal rate, regular rhythm, normal heart sounds and intact distal pulses.  Exam reveals no gallop and no friction rub.   No murmur heard. Pulmonary/Chest:  Effort normal and breath sounds normal. No respiratory distress. He has no wheezes. He has no rales. He exhibits no tenderness.  Abdominal: Soft. Bowel sounds are normal. He exhibits no distension and no mass. There is no tenderness. There is no rebound and no guarding.  Genitourinary: Penis normal. No penile tenderness.  No genital sores.  No inguinal hernia bilaterally.  No testicular tenderness.  No penile tenderness or discharge.   Musculoskeletal: Normal range of motion. He exhibits no edema and no tenderness.  No lower lumbar tenderness bilaterally.  Patient able to ambulate without difficulty or ataxia  Neurological: He is alert and oriented to person, place, and time. No cranial nerve deficit.  Skin: Skin is warm and dry. He is not diaphoretic.  Psychiatric: He has a normal mood and affect. His behavior is normal.    ED Course  Procedures (including critical care time) DIAGNOSTIC STUDIES: Oxygen Saturation is 100% on RA, normal by my interpretation.   COORDINATION OF CARE: 1:09 PM- Will check for gonorrhea and chlamydia and perform a urinalysis. Will give pt Tylenol in the ED for pain. Pt verbalizes understanding and agrees to plan.  Medications  acetaminophen (TYLENOL) tablet 650 mg (650 mg Oral Given 08/03/13 1331)   Labs Review Labs Reviewed  URINALYSIS, ROUTINE W REFLEX MICROSCOPIC - Abnormal; Notable for the following:    APPearance CLOUDY (*)    Hgb urine dipstick TRACE (*)    All other components within normal limits  CBC WITH DIFFERENTIAL - Abnormal; Notable for the following:    RBC 4.01 (*)    Hemoglobin 12.1 (*)    HCT 34.8 (*)    All other components within normal limits  GC/CHLAMYDIA PROBE AMP  URINE MICROSCOPIC-ADD ON  RPR  HIV ANTIBODY (ROUTINE TESTING)  BASIC METABOLIC PANEL   Imaging Review No results found.  EKG Interpretation   None      Results for orders placed during the hospital encounter of 08/03/13  GC/CHLAMYDIA PROBE AMP      Result  Value Range   CT Probe RNA NEGATIVE  NEGATIVE   GC Probe RNA NEGATIVE  NEGATIVE  URINALYSIS, ROUTINE W REFLEX MICROSCOPIC      Result Value Range   Color, Urine YELLOW  YELLOW   APPearance CLOUDY (*) CLEAR   Specific Gravity, Urine 1.012  1.005 - 1.030   pH 6.0  5.0 - 8.0   Glucose, UA NEGATIVE  NEGATIVE mg/dL   Hgb urine dipstick TRACE (*) NEGATIVE   Bilirubin Urine NEGATIVE  NEGATIVE   Ketones, ur NEGATIVE  NEGATIVE mg/dL   Protein, ur NEGATIVE  NEGATIVE mg/dL   Urobilinogen, UA 0.2  0.0 - 1.0 mg/dL   Nitrite NEGATIVE  NEGATIVE   Leukocytes, UA NEGATIVE  NEGATIVE  RPR      Result Value Range   RPR NON REACTIVE  NON REACTIVE  HIV ANTIBODY (ROUTINE TESTING)      Result Value Range   HIV NON REACTIVE  NON REACTIVE  URINE MICROSCOPIC-ADD ON      Result Value Range   Squamous Epithelial /  LPF RARE  RARE  CBC WITH DIFFERENTIAL      Result Value Range   WBC 4.8  4.0 - 10.5 K/uL   RBC 4.01 (*) 4.22 - 5.81 MIL/uL   Hemoglobin 12.1 (*) 13.0 - 17.0 g/dL   HCT 54.0 (*) 98.1 - 19.1 %   MCV 86.8  78.0 - 100.0 fL   MCH 30.2  26.0 - 34.0 pg   MCHC 34.8  30.0 - 36.0 g/dL   RDW 47.8  29.5 - 62.1 %   Platelets 248  150 - 400 K/uL   Neutrophils Relative % 54  43 - 77 %   Neutro Abs 2.6  1.7 - 7.7 K/uL   Lymphocytes Relative 33  12 - 46 %   Lymphs Abs 1.6  0.7 - 4.0 K/uL   Monocytes Relative 10  3 - 12 %   Monocytes Absolute 0.5  0.1 - 1.0 K/uL   Eosinophils Relative 3  0 - 5 %   Eosinophils Absolute 0.1  0.0 - 0.7 K/uL   Basophils Relative 0  0 - 1 %   Basophils Absolute 0.0  0.0 - 0.1 K/uL  BASIC METABOLIC PANEL      Result Value Range   Sodium 135  135 - 145 mEq/L   Potassium 3.3 (*) 3.5 - 5.1 mEq/L   Chloride 99  96 - 112 mEq/L   CO2 28  19 - 32 mEq/L   Glucose, Bld 75  70 - 99 mg/dL   BUN 7  6 - 23 mg/dL   Creatinine, Ser 3.08  0.50 - 1.35 mg/dL   Calcium 9.4  8.4 - 65.7 mg/dL   GFR calc non Af Amer >90  >90 mL/min   GFR calc Af Amer >90  >90 mL/min    MDM   Hughey Rittenberry is a 42 y.o. male  male who presents to the Emergency Department complaining of dysuria for approximately three days.    Rechecks  1:15 PM = Rectal exam performed at bedside with RN staff present.  Exam difficult due to compliance of patient.  Patient has increased pain with palpation of the prostate.  Unable to determine characteristics regarding the patient's prostate.  Good sphincter tone.  No rectal bleeding/gross blood/stool.     Etiology of dysuria possibly due to prostatitis.  Patient had pain with palpation of the prostate.  Will be treated with Cipro.  Patient recently had G/C testing on 06/19/13 which was negative, however, was repeated today.  Patient non-toxic in appearance.  He is afebrile.  No leukocytosis.  No UTI. Abdominal exam benign.  Patient instructed to follow-up with his PCP (given resource guide).  Return precautions were given.    Discharge Medication List as of 08/03/2013  2:27 PM    START taking these medications   Details  ciprofloxacin (CIPRO) 500 MG tablet Take 1 tablet (500 mg total) by mouth 2 (two) times daily., Starting 08/03/2013, Last dose on Sat 08/23/13, Print         Final impressions: 1. Prostatitis      Luiz Iron PA-C   This patient was discussed with Dr. Clayborne Dana, PA-C 08/05/13 1404

## 2013-08-07 NOTE — ED Provider Notes (Signed)
Medical screening examination/treatment/procedure(s) were performed by non-physician practitioner and as supervising physician I was immediately available for consultation/collaboration.   Nelia Shi, MD 08/07/13 [phone]

## 2015-11-08 ENCOUNTER — Encounter (HOSPITAL_COMMUNITY): Payer: Self-pay | Admitting: Emergency Medicine

## 2015-11-08 ENCOUNTER — Emergency Department (HOSPITAL_COMMUNITY)
Admission: EM | Admit: 2015-11-08 | Discharge: 2015-11-09 | Disposition: A | Discharge status: Home / Self Care | Payer: Self-pay | Attending: Emergency Medicine | Admitting: Emergency Medicine

## 2015-11-08 DIAGNOSIS — M4690 Unspecified inflammatory spondylopathy, site unspecified: Secondary | ICD-10-CM

## 2015-11-08 DIAGNOSIS — M545 Low back pain, unspecified: Secondary | ICD-10-CM

## 2015-11-08 DIAGNOSIS — R109 Unspecified abdominal pain: Secondary | ICD-10-CM | POA: Insufficient documentation

## 2015-11-08 DIAGNOSIS — R110 Nausea: Secondary | ICD-10-CM | POA: Insufficient documentation

## 2015-11-08 DIAGNOSIS — M4696 Unspecified inflammatory spondylopathy, lumbar region: Secondary | ICD-10-CM | POA: Insufficient documentation

## 2015-11-08 NOTE — ED Notes (Signed)
Per EMS: pt was running to bus and tripped over shoe and fell. Pt was laying on ground face down, pt c/o lower lumbar pain. ETOH on board. Pt states he cannot stand or walk.

## 2015-11-09 ENCOUNTER — Emergency Department (HOSPITAL_COMMUNITY): Payer: Self-pay

## 2015-11-09 LAB — COMPREHENSIVE METABOLIC PANEL
ALT: 24 U/L (ref 17–63)
ANION GAP: 13 (ref 5–15)
AST: 33 U/L (ref 15–41)
Albumin: 4.7 g/dL (ref 3.5–5.0)
Alkaline Phosphatase: 69 U/L (ref 38–126)
BUN: 10 mg/dL (ref 6–20)
CHLORIDE: 105 mmol/L (ref 101–111)
CO2: 23 mmol/L (ref 22–32)
CREATININE: 0.65 mg/dL (ref 0.61–1.24)
Calcium: 9.5 mg/dL (ref 8.9–10.3)
Glucose, Bld: 102 mg/dL — ABNORMAL HIGH (ref 65–99)
POTASSIUM: 3.9 mmol/L (ref 3.5–5.1)
SODIUM: 141 mmol/L (ref 135–145)
Total Bilirubin: 0.5 mg/dL (ref 0.3–1.2)
Total Protein: 8.4 g/dL — ABNORMAL HIGH (ref 6.5–8.1)

## 2015-11-09 LAB — URINALYSIS, ROUTINE W REFLEX MICROSCOPIC
Bilirubin Urine: NEGATIVE
GLUCOSE, UA: NEGATIVE mg/dL
Ketones, ur: NEGATIVE mg/dL
LEUKOCYTES UA: NEGATIVE
Nitrite: NEGATIVE
PROTEIN: NEGATIVE mg/dL
SPECIFIC GRAVITY, URINE: 1.011 (ref 1.005–1.030)
pH: 6 (ref 5.0–8.0)

## 2015-11-09 LAB — CBC WITH DIFFERENTIAL/PLATELET
BASOS ABS: 0 10*3/uL (ref 0.0–0.1)
Basophils Relative: 0 %
EOS PCT: 4 %
Eosinophils Absolute: 0.3 10*3/uL (ref 0.0–0.7)
HCT: 34.7 % — ABNORMAL LOW (ref 39.0–52.0)
HEMOGLOBIN: 12 g/dL — AB (ref 13.0–17.0)
LYMPHS ABS: 2.1 10*3/uL (ref 0.7–4.0)
LYMPHS PCT: 32 %
MCH: 30.3 pg (ref 26.0–34.0)
MCHC: 34.6 g/dL (ref 30.0–36.0)
MCV: 87.6 fL (ref 78.0–100.0)
Monocytes Absolute: 0.5 10*3/uL (ref 0.1–1.0)
Monocytes Relative: 7 %
NEUTROS ABS: 3.8 10*3/uL (ref 1.7–7.7)
NEUTROS PCT: 57 %
PLATELETS: 236 10*3/uL (ref 150–400)
RBC: 3.96 MIL/uL — AB (ref 4.22–5.81)
RDW: 13.4 % (ref 11.5–15.5)
WBC: 6.7 10*3/uL (ref 4.0–10.5)

## 2015-11-09 LAB — URINE MICROSCOPIC-ADD ON
BACTERIA UA: NONE SEEN
SQUAMOUS EPITHELIAL / LPF: NONE SEEN
WBC, UA: NONE SEEN WBC/hpf (ref 0–5)

## 2015-11-09 LAB — ETHANOL: ALCOHOL ETHYL (B): 61 mg/dL — AB (ref <5)

## 2015-11-09 MED ORDER — ONDANSETRON HCL 4 MG/2ML IJ SOLN
4.0000 mg | Freq: Once | INTRAMUSCULAR | Status: AC
  Administered 2015-11-09: 4 mg via INTRAVENOUS
  Filled 2015-11-09: qty 2

## 2015-11-09 MED ORDER — NAPROXEN 500 MG PO TABS: ORAL_TABLET | ORAL | Status: AC

## 2015-11-09 MED ORDER — DIAZEPAM 5 MG/ML IJ SOLN
2.5000 mg | Freq: Once | INTRAMUSCULAR | Status: AC
  Administered 2015-11-09: 2.5 mg via INTRAVENOUS
  Filled 2015-11-09: qty 2

## 2015-11-09 MED ORDER — KETOROLAC TROMETHAMINE 30 MG/ML IJ SOLN
30.0000 mg | Freq: Once | INTRAMUSCULAR | Status: AC
  Administered 2015-11-09: 30 mg via INTRAVENOUS
  Filled 2015-11-09: qty 1

## 2015-11-09 NOTE — Discharge Instructions (Signed)
Use ice and heat on your back. Take the naproxen for pain, when it runs out you can take Aleve 1-2 tabs OTC twice a day for pain. I gave you the name of 2 different back specialists that you can follow up with for further treatment of your back.  Heat Therapy Heat therapy can help ease sore, stiff, injured, and tight muscles and joints. Heat relaxes your muscles, which may help ease your pain. Heat therapy should only be used on old, pre-existing, or long-lasting (chronic) injuries. Do not use heat therapy unless told by your doctor. HOW TO USE HEAT THERAPY There are several different kinds of heat therapy, including:  Moist heat pack.  Warm water bath.  Hot water bottle.  Electric heating pad.  Heated gel pack.  Heated wrap.  Electric heating pad. GENERAL HEAT THERAPY RECOMMENDATIONS   Do not sleep while using heat therapy. Only use heat therapy while you are awake.  Your skin may turn pink while using heat therapy. Do not use heat therapy if your skin turns red.  Do not use heat therapy if you have new pain.  High heat or long exposure to heat can cause burns. Be careful when using heat therapy to avoid burning your skin.  Do not use heat therapy on areas of your skin that are already irritated, such as with a rash or sunburn. GET HELP IF:   You have blisters, redness, swelling (puffiness), or numbness.  You have new pain.  Your pain is worse. MAKE SURE YOU:  Understand these instructions.  Will watch your condition.  Will get help right away if you are not doing well or get worse.   This information is not intended to replace advice given to you by your health care provider. Make sure you discuss any questions you have with your health care provider.   Document Released: 11/06/2011 Document Revised: 09/04/2014 Document Reviewed: 10/07/2013 Elsevier Interactive Patient Education [employer]

## 2015-11-09 NOTE — ED Provider Notes (Signed)
CSN: [id number]     Arrival date & time 11/08/15  2050 History  By signing my name below, I, Gonzella Lex, attest that this documentation has been prepared under the direction and in the presence of Devoria Albe, MD at 01:21. Electronically Signed: Gonzella Lex, Scribe. 11/09/2015. 1:31 AM.   Chief Complaint  Patient presents with  . Back Pain   The history is provided by the patient. No language interpreter was used.   HPI Comments: James Joseph is a 45 y.o. male who presents to the Emergency Department complaining of sudden onset of lower back pain which began "a long time ago", is bothering him today, and is worse today with urination. He denies tenderness to palpation of his back but states that it "hurts inside". He has been seen before for his back pain and has been x-rayed but notes that nothing was found. Pt also notes that he slipped and fell forward onto his abdomen today while chasing the bus, which has made his back pain worse. He also reports associated lower abdominal pain, the back pain is worse with movement and breathing, as well as mild nausea earlier today which has since resolved. Pt denies vomiting and states that he does not currently have a physician. Pt drinks but does not smoke.   PCP none  History reviewed. No pertinent past medical history. History reviewed. No pertinent past surgical history. History reviewed. No pertinent family history. Social History  Substance Use Topics  . Smoking status: Never Smoker   . Smokeless tobacco: Never Used  . Alcohol Use: Yes     Comment: Pt states "not drink in 2-3 weeks now"  employed   Review of Systems  Gastrointestinal: Positive for nausea and abdominal pain. Negative for vomiting.  Musculoskeletal: Positive for back pain.  All other systems reviewed and are negative.  Allergies  Hydrocodone and Ibuprofen  Home Medications   Prior to Admission medications   Medication Sig Start Date End Date Taking?  Authorizing Provider  naproxen (NAPROSYN) 500 MG tablet Take 1 po BID with food prn pain 11/09/15   Devoria Albe, MD   BP 124/107 mmHg  Pulse 73  Temp(Src) 98.3 F (36.8 C) (Oral)  Resp 22  SpO2 99%  Vital signs normal   Physical Exam  Constitutional: He is oriented to person, place, and time. He appears well-developed and well-nourished.  Non-toxic appearance. He does not appear ill. No distress.  Pt seems painful when he changes positions.  HENT:  Head: Normocephalic and atraumatic.  Right Ear: External ear normal.  Left Ear: External ear normal.  Nose: Nose normal. No mucosal edema or rhinorrhea.  Mouth/Throat: Oropharynx is clear and moist and mucous membranes are normal. No dental abscesses or uvula swelling.  Eyes: Conjunctivae and EOM are normal. Pupils are equal, round, and reactive to light.  Neck: Normal range of motion and full passive range of motion without pain. Neck supple.  Cardiovascular: Normal rate, regular rhythm and normal heart sounds.  Exam reveals no gallop and no friction rub.   No murmur heard. Pulmonary/Chest: Effort normal and breath sounds normal. No respiratory distress. He has no wheezes. He has no rhonchi. He has no rales. He exhibits no tenderness and no crepitus.  Abdominal: Soft. Normal appearance and bowel sounds are normal. He exhibits distension. There is no tenderness. There is no rebound and no guarding.  Musculoskeletal: Normal range of motion. He exhibits no edema or tenderness.       Back:  Area of back pain noted. Moves all extremities well.   Neurological: He is alert and oriented to person, place, and time. He has normal strength. No cranial nerve deficit.  Skin: Skin is warm, dry and intact. No rash noted. No erythema. No pallor.  Psychiatric: He has a normal mood and affect. His speech is normal and behavior is normal. His mood appears not anxious.  Nursing note and vitals reviewed.   ED Course  Procedures   Medications  ketorolac  (TORADOL) 30 MG/ML injection 30 mg (30 mg Intravenous Given 11/09/15 0152)  diazepam (VALIUM) injection 2.5 mg (2.5 mg Intravenous Given 11/09/15 0149)  ondansetron (ZOFRAN) injection 4 mg (4 mg Intravenous Given 11/09/15 0150)    DIAGNOSTIC STUDIES:    Oxygen Saturation is 98% on RA, normal by my interpretation.   COORDINATION OF CARE:  1:29 AM Will administer pt IV fluids and pain medication. Will order CT scan of bladder and abdomen. Discussed treatment plan with pt at bedside and pt agreed to plan.   Bladder scan was done and he had 275 mL urine in his bladder.  3:24 AM Pt states that he feels his pain going down. Pt informed that his urine looks good with no infection, kidney and liver tests are normal. Pt is mildly anemic. CT scan shows no kidney stones, pancreas looks good. Angles of bones in pt's back are slightly off with arthritis of the back bones, mainly in the lower back. Will give pt the name of two back specialists to follow up with for his back pain  and will put pt on antiinflammatory for back pain.    Results for orders placed or performed during the hospital encounter of 11/08/15  Comprehensive metabolic panel  Result Value Ref Range   Sodium 141 135 - 145 mmol/L   Potassium 3.9 3.5 - 5.1 mmol/L   Chloride 105 101 - 111 mmol/L   CO2 23 22 - 32 mmol/L   Glucose, Bld 102 (H) 65 - 99 mg/dL   BUN 10 6 - 20 mg/dL   Creatinine, Ser 9.600.65 0.61 - 1.24 mg/dL   Calcium 9.5 8.9 - 45.410.3 mg/dL   Total Protein 8.4 (H) 6.5 - 8.1 g/dL   Albumin 4.7 3.5 - 5.0 g/dL   AST 33 15 - 41 U/L   ALT 24 17 - 63 U/L   Alkaline Phosphatase 69 38 - 126 U/L   Total Bilirubin 0.5 0.3 - 1.2 mg/dL   GFR calc non Af Amer >60 >60 mL/min   GFR calc Af Amer >60 >60 mL/min   Anion gap 13 5 - 15  Ethanol  Result Value Ref Range   Alcohol, Ethyl (B) 61 (H) <5 mg/dL  CBC with Differential  Result Value Ref Range   WBC 6.7 4.0 - 10.5 K/uL   RBC 3.96 (L) 4.22 - 5.81 MIL/uL   Hemoglobin 12.0 (L) 13.0 -  17.0 g/dL   HCT 09.834.7 (L) 11.939.0 - 14.752.0 %   MCV 87.6 78.0 - 100.0 fL   MCH 30.3 26.0 - 34.0 pg   MCHC 34.6 30.0 - 36.0 g/dL   RDW 82.913.4 56.211.5 - 13.015.5 %   Platelets 236 150 - 400 K/uL   Neutrophils Relative % 57 %   Neutro Abs 3.8 1.7 - 7.7 K/uL   Lymphocytes Relative 32 %   Lymphs Abs 2.1 0.7 - 4.0 K/uL   Monocytes Relative 7 %   Monocytes Absolute 0.5 0.1 - 1.0 K/uL   Eosinophils Relative 4 %  Eosinophils Absolute 0.3 0.0 - 0.7 K/uL   Basophils Relative 0 %   Basophils Absolute 0.0 0.0 - 0.1 K/uL  Urinalysis, Routine w reflex microscopic  Result Value Ref Range   Color, Urine YELLOW YELLOW   APPearance CLEAR CLEAR   Specific Gravity, Urine 1.011 1.005 - 1.030   pH 6.0 5.0 - 8.0   Glucose, UA NEGATIVE NEGATIVE mg/dL   Hgb urine dipstick TRACE (A) NEGATIVE   Bilirubin Urine NEGATIVE NEGATIVE   Ketones, ur NEGATIVE NEGATIVE mg/dL   Protein, ur NEGATIVE NEGATIVE mg/dL   Nitrite NEGATIVE NEGATIVE   Leukocytes, UA NEGATIVE NEGATIVE  Urine microscopic-add on  Result Value Ref Range   Squamous Epithelial / LPF NONE SEEN NONE SEEN   WBC, UA NONE SEEN 0 - 5 WBC/hpf   RBC / HPF 0-5 0 - 5 RBC/hpf   Bacteria, UA NONE SEEN NONE SEEN   Laboratory interpretation all normal except mild anemia    Ct Renal Stone Study  11/09/2015  CLINICAL DATA:  Bilateral flank pain EXAM: CT ABDOMEN AND PELVIS WITHOUT CONTRAST TECHNIQUE: Multidetector CT imaging of the abdomen and pelvis was performed following the standard protocol without IV contrast. COMPARISON:  12/19/2011 FINDINGS: Lower chest and abdominal wall:  No contributory findings. Hepatobiliary: Stable dystrophic appearing central hepatic calcifications. These do not appear branching or associated with duct dilatation.No evidence of biliary obstruction or stone. Pancreas: Unremarkable. Spleen: Unremarkable. Adrenals/Urinary Tract: Negative adrenals. No hydronephrosis or stone. Unremarkable bladder. Reproductive:No pathologic findings. Stomach/Bowel:   No obstruction. No appendicitis. Vascular/Lymphatic: No acute vascular abnormality. No mass or adenopathy. Peritoneal: No ascites or pneumoperitoneum. Musculoskeletal: Chronic sacroiliac fusion with diffuse progressive spinal ankylosis from smooth syndesmophytes. There is a benign lucency in the right ilium with peripheral sclerosis, stable. IMPRESSION: 1. No acute finding.  No hydronephrosis or urolithiasis. 2. 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 Electronically Signed   By: Marnee Spring M.D.   On: 11/09/2015 02:31      I have personally reviewed and evaluated these images and lab results as part of my medical decision-making.  MDM   Final diagnoses:  Midline low back pain without sciatica  Inflammatory spondylopathy, unspecified spinal region [facility])    New Prescriptions   NAPROXEN (NAPROSYN) 500 MG TABLET    Take 1 po BID with food prn pain    Plan discharge  Devoria Albe, MD, Concha Pyo, MD 11/09/15 [phone]

## 2016-07-17 ENCOUNTER — Encounter (HOSPITAL_COMMUNITY): Payer: Self-pay | Admitting: Emergency Medicine

## 2018-03-06 IMAGING — CT CT RENAL STONE PROTOCOL
2 of 3 series · 15 of 28 positions shown, 17 images · non-contrast
Comparison: 12/19/2011

CLINICAL DATA: Bilateral flank pain

EXAM:
CT ABDOMEN AND PELVIS WITHOUT CONTRAST
TECHNIQUE: Multidetector CT imaging of the abdomen and pelvis was performed
following the standard protocol without IV contrast.

[Series 3: lung · axial · 0.60mm/px · z∈[+38,+93]mm · 12 of 14 slices shown, 14 images]
[im 2/14  soft-tissue]
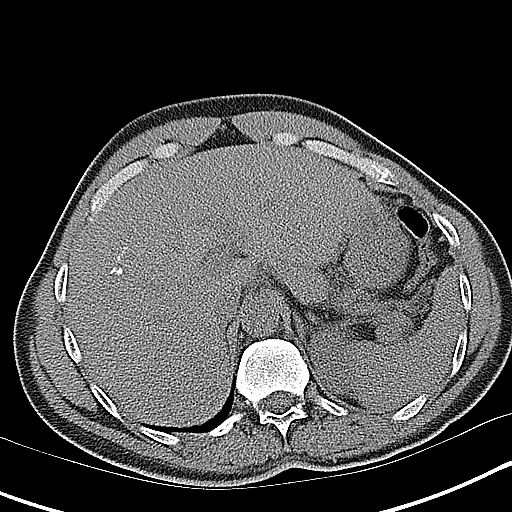
[im 2/14  bone]
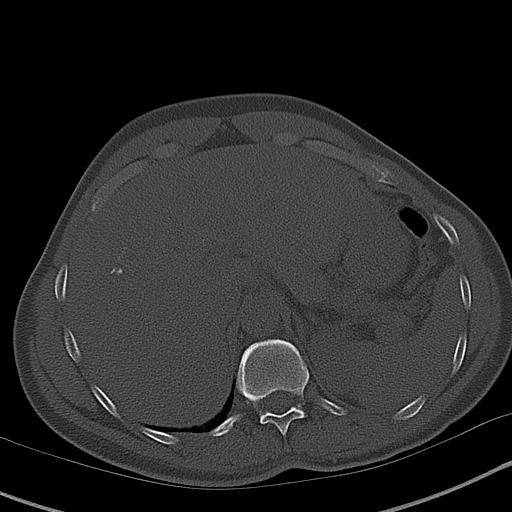
[im 3/14  soft-tissue]
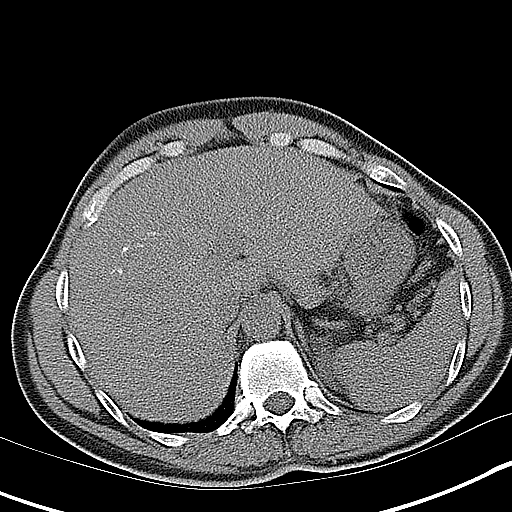
[im 4/14  soft-tissue]
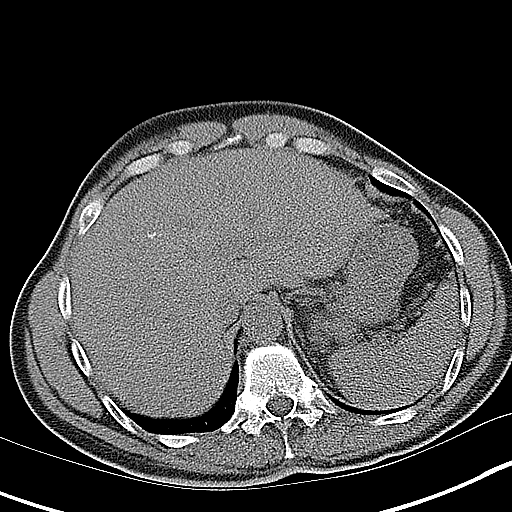
[im 5/14  soft-tissue]
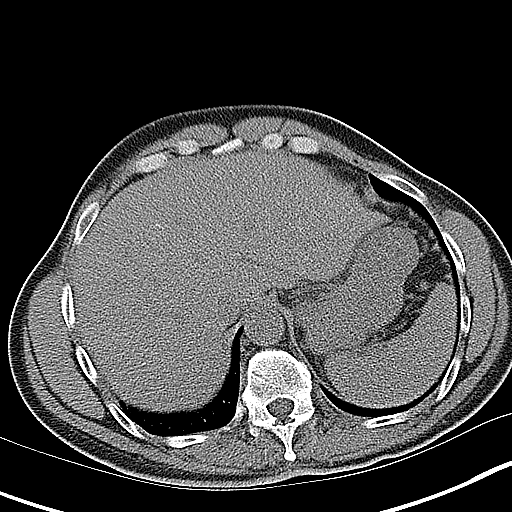
[im 6/14  soft-tissue]
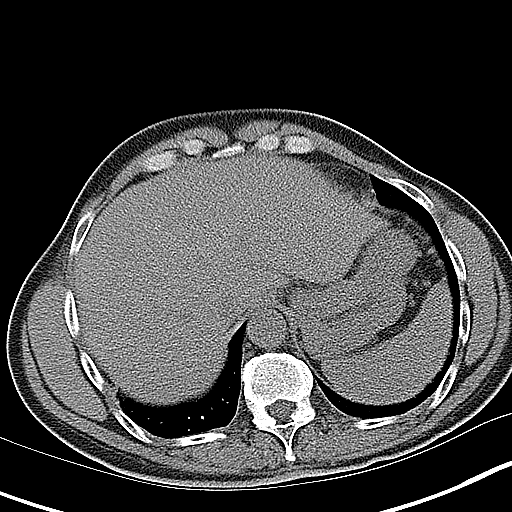
[im 7/14  soft-tissue]
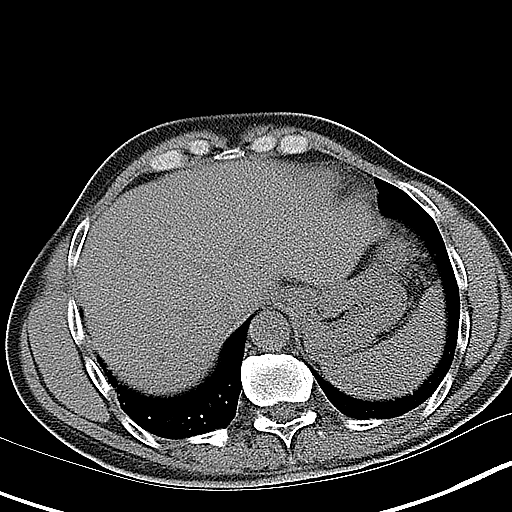
[im 8/14  soft-tissue]
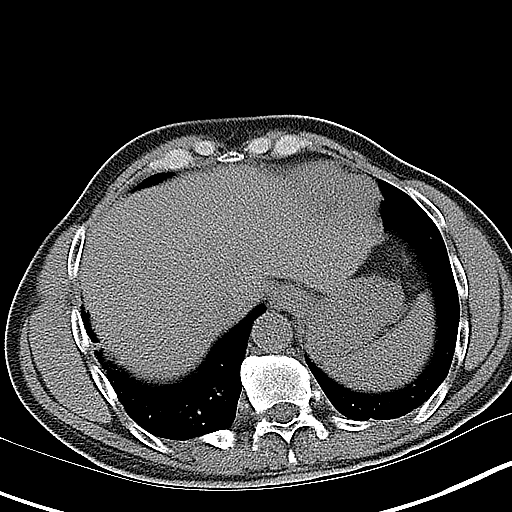
[im 9/14  soft-tissue]
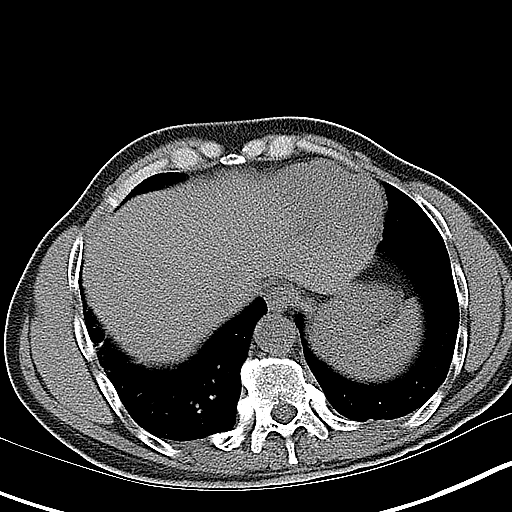
[im 10/14  soft-tissue]
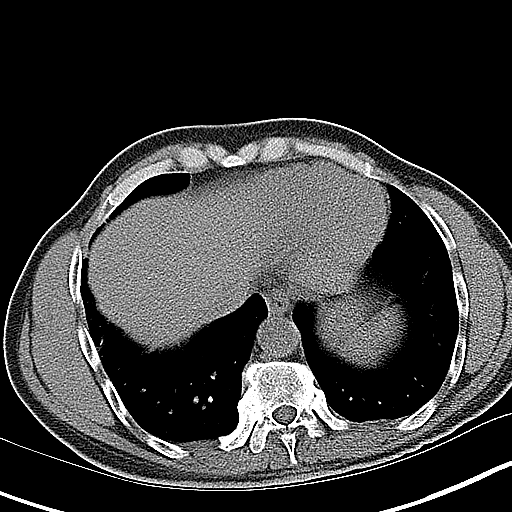
[im 10/14  bone]
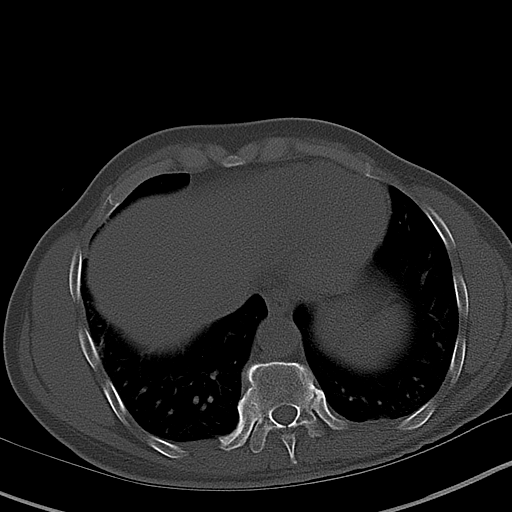
[im 11/14  soft-tissue]
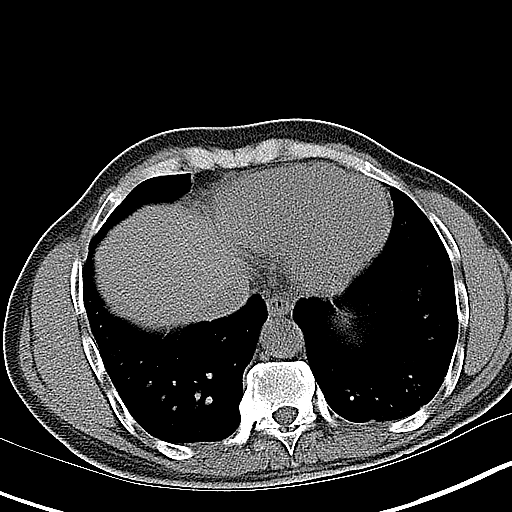
[im 12/14  soft-tissue]
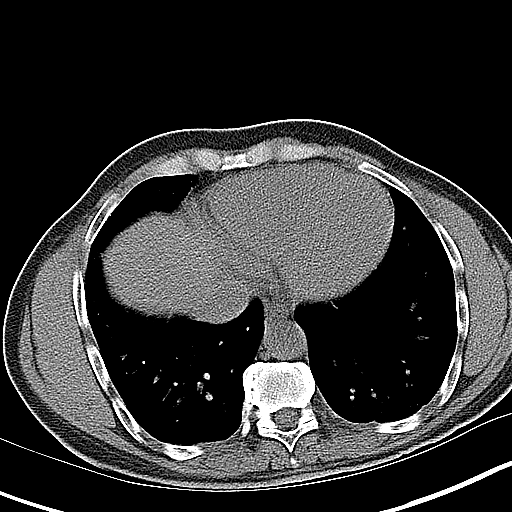
[im 13/14  soft-tissue]
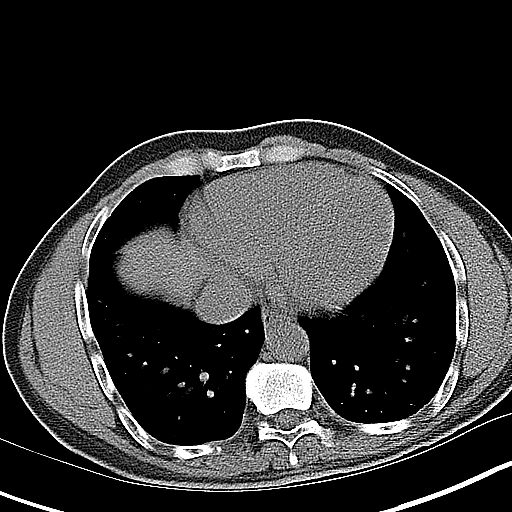

[Series 4: coronal · coronal · 0.54mm/px · 3 of 73 slices shown]
[im 25/73  soft-tissue]
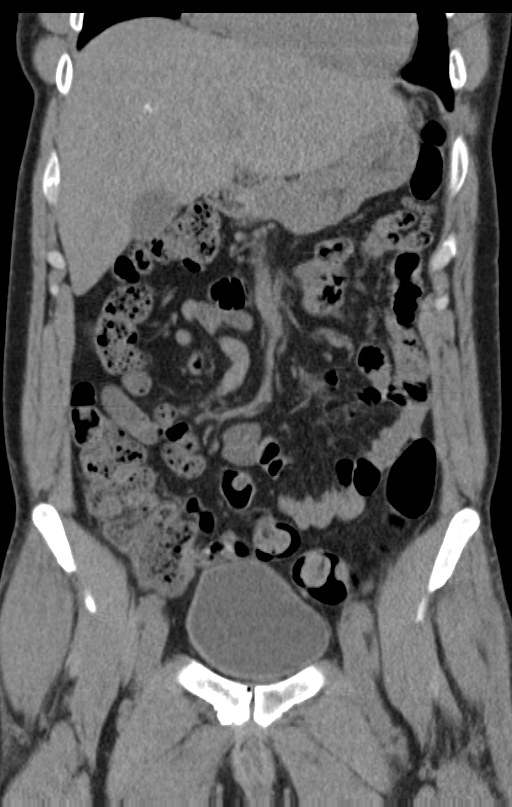
[im 33/73  soft-tissue]
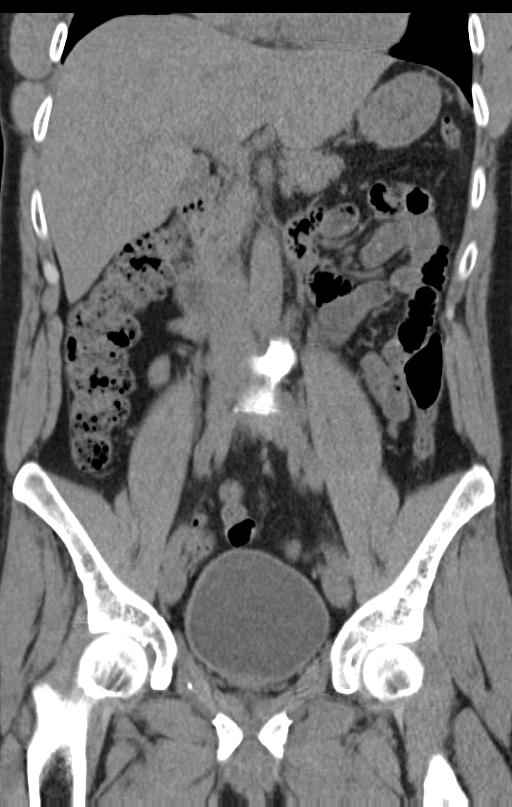
[im 41/73  soft-tissue]
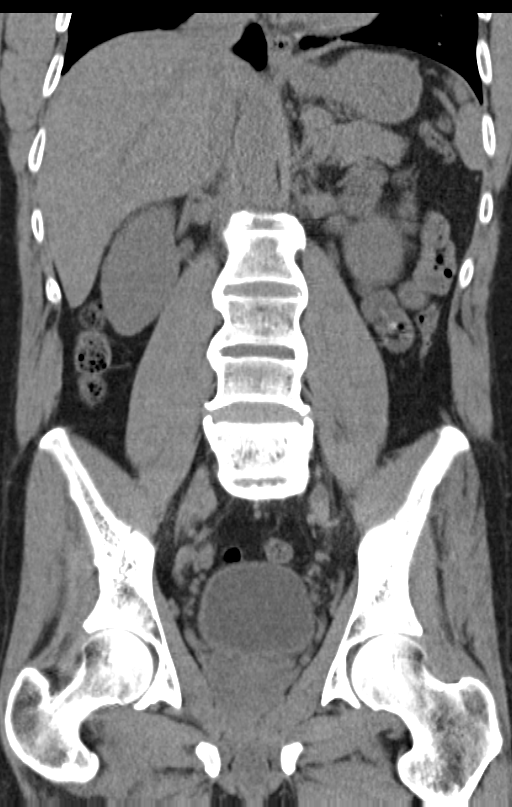

[15 of 28 positions shown; findings below may reference images not displayed]

FINDINGS: Lower chest and abdominal wall:  No contributory findings.

Hepatobiliary: Stable dystrophic appearing central hepatic
calcifications. These do not appear branching or associated with
duct dilatation.No evidence of biliary obstruction or stone.

Pancreas: Unremarkable.

Spleen: Unremarkable.

Adrenals/Urinary Tract: Negative adrenals. No hydronephrosis or
stone. Unremarkable bladder.

Reproductive:No pathologic findings.

Stomach/Bowel:  No obstruction. No appendicitis.

Vascular/Lymphatic: No acute vascular abnormality. No mass or
adenopathy.

Peritoneal: No ascites or pneumoperitoneum.

Musculoskeletal: Chronic sacroiliac fusion with diffuse progressive
spinal ankylosis from smooth syndesmophytes. There is a benign
lucency in the right ilium with peripheral sclerosis, stable.
IMPRESSION: 1. No acute finding.  No hydronephrosis or urolithiasis.
2. Spondyloarthropathy with spinal ankylosis and sacroiliac fusion.
